# Patient Record
Sex: Female | Born: 1945
Health system: Southern US, Community
[De-identification: ages and names within clinical notes are randomized; demographics above are authoritative.]

## PROBLEM LIST (undated history)

## (undated) DIAGNOSIS — G35 Multiple sclerosis: Secondary | ICD-10-CM

## (undated) DIAGNOSIS — I639 Cerebral infarction, unspecified: Secondary | ICD-10-CM

## (undated) DIAGNOSIS — I10 Essential (primary) hypertension: Secondary | ICD-10-CM

## (undated) DIAGNOSIS — A159 Respiratory tuberculosis unspecified: Secondary | ICD-10-CM

## (undated) DIAGNOSIS — E785 Hyperlipidemia, unspecified: Secondary | ICD-10-CM

## (undated) HISTORY — DX: Hyperlipidemia, unspecified: E78.5

## (undated) HISTORY — PX: ABDOMINAL HYSTERECTOMY: SHX81

## (undated) HISTORY — DX: Essential (primary) hypertension: I10

## (undated) HISTORY — DX: Multiple sclerosis: G35

## (undated) HISTORY — PX: FLEXIBLE SIGMOIDOSCOPY: SHX1649

## (undated) HISTORY — PX: TUBAL LIGATION: SHX77

## (undated) HISTORY — DX: Respiratory tuberculosis unspecified: A15.9

## (undated) HISTORY — PX: OTHER SURGICAL HISTORY: SHX169

---

## 1998-11-25 ENCOUNTER — Other Ambulatory Visit: Admission: RE | Admit: 1998-11-25 | Discharge: 1998-11-25 | Payer: Self-pay | Admitting: Gynecology

## 1999-01-12 ENCOUNTER — Ambulatory Visit (HOSPITAL_BASED_OUTPATIENT_CLINIC_OR_DEPARTMENT_OTHER): Admission: RE | Admit: 1999-01-12 | Discharge: 1999-01-12 | Payer: Self-pay | Admitting: General Surgery

## 1999-01-12 ENCOUNTER — Encounter: Payer: Self-pay | Admitting: General Surgery

## 1999-08-11 ENCOUNTER — Encounter: Admission: RE | Admit: 1999-08-11 | Discharge: 1999-08-11 | Payer: Self-pay | Admitting: Internal Medicine

## 1999-08-11 ENCOUNTER — Encounter: Payer: Self-pay | Admitting: Internal Medicine

## 1999-12-15 ENCOUNTER — Other Ambulatory Visit: Admission: RE | Admit: 1999-12-15 | Discharge: 1999-12-15 | Payer: Self-pay | Admitting: Gynecology

## 1999-12-30 ENCOUNTER — Encounter: Payer: Self-pay | Admitting: Gynecology

## 1999-12-30 ENCOUNTER — Encounter: Admission: RE | Admit: 1999-12-30 | Discharge: 1999-12-30 | Payer: Self-pay | Admitting: Gynecology

## 2000-02-02 ENCOUNTER — Encounter: Admission: RE | Admit: 2000-02-02 | Discharge: 2000-03-30 | Payer: Self-pay | Admitting: Chiropractic Medicine

## 2000-11-23 ENCOUNTER — Other Ambulatory Visit: Admission: RE | Admit: 2000-11-23 | Discharge: 2000-11-23 | Payer: Self-pay | Admitting: Gynecology

## 2001-01-01 ENCOUNTER — Encounter: Admission: RE | Admit: 2001-01-01 | Discharge: 2001-01-01 | Payer: Self-pay | Admitting: Gynecology

## 2001-01-01 ENCOUNTER — Encounter: Payer: Self-pay | Admitting: Gynecology

## 2002-02-06 ENCOUNTER — Encounter: Admission: RE | Admit: 2002-02-06 | Discharge: 2002-02-06 | Payer: Self-pay | Admitting: Gynecology

## 2002-02-06 ENCOUNTER — Encounter: Payer: Self-pay | Admitting: Gynecology

## 2002-03-15 ENCOUNTER — Other Ambulatory Visit: Admission: RE | Admit: 2002-03-15 | Discharge: 2002-03-15 | Payer: Self-pay | Admitting: Gynecology

## 2003-05-01 ENCOUNTER — Other Ambulatory Visit: Admission: RE | Admit: 2003-05-01 | Discharge: 2003-05-01 | Payer: Self-pay | Admitting: Gynecology

## 2004-04-20 ENCOUNTER — Encounter: Admission: RE | Admit: 2004-04-20 | Discharge: 2004-04-20 | Payer: Self-pay | Admitting: Gynecology

## 2004-06-24 ENCOUNTER — Ambulatory Visit: Payer: Self-pay | Admitting: Internal Medicine

## 2004-07-02 ENCOUNTER — Ambulatory Visit: Payer: Self-pay | Admitting: Internal Medicine

## 2004-09-23 ENCOUNTER — Ambulatory Visit: Payer: Self-pay | Admitting: Internal Medicine

## 2004-09-30 ENCOUNTER — Ambulatory Visit: Payer: Self-pay | Admitting: Internal Medicine

## 2005-04-06 ENCOUNTER — Ambulatory Visit: Payer: Self-pay | Admitting: Internal Medicine

## 2005-05-13 ENCOUNTER — Other Ambulatory Visit: Admission: RE | Admit: 2005-05-13 | Discharge: 2005-05-13 | Payer: Self-pay | Admitting: Gynecology

## 2005-05-20 ENCOUNTER — Encounter: Admission: RE | Admit: 2005-05-20 | Discharge: 2005-05-20 | Payer: Self-pay | Admitting: Gynecology

## 2005-10-11 ENCOUNTER — Ambulatory Visit: Payer: Self-pay | Admitting: Internal Medicine

## 2006-04-03 ENCOUNTER — Ambulatory Visit: Payer: Self-pay | Admitting: Internal Medicine

## 2006-05-23 ENCOUNTER — Encounter: Admission: RE | Admit: 2006-05-23 | Discharge: 2006-05-23 | Payer: Self-pay | Admitting: Gynecology

## 2006-07-11 ENCOUNTER — Ambulatory Visit: Payer: Self-pay | Admitting: Internal Medicine

## 2006-07-11 LAB — CONVERTED CEMR LAB
Chol/HDL Ratio, serum: 2.2
Cholesterol: 238 mg/dL (ref 0–200)
LDL DIRECT: 105.7 mg/dL

## 2006-11-22 ENCOUNTER — Encounter: Payer: Self-pay | Admitting: Internal Medicine

## 2006-11-28 ENCOUNTER — Ambulatory Visit: Payer: Self-pay | Admitting: Internal Medicine

## 2007-03-02 ENCOUNTER — Ambulatory Visit: Payer: Self-pay | Admitting: Internal Medicine

## 2007-04-25 ENCOUNTER — Telehealth: Payer: Self-pay | Admitting: Internal Medicine

## 2007-06-12 ENCOUNTER — Ambulatory Visit: Payer: Self-pay | Admitting: Internal Medicine

## 2007-06-12 DIAGNOSIS — I10 Essential (primary) hypertension: Secondary | ICD-10-CM

## 2007-06-12 DIAGNOSIS — G35 Multiple sclerosis: Secondary | ICD-10-CM

## 2007-06-12 DIAGNOSIS — R32 Unspecified urinary incontinence: Secondary | ICD-10-CM | POA: Insufficient documentation

## 2007-06-12 DIAGNOSIS — E785 Hyperlipidemia, unspecified: Secondary | ICD-10-CM

## 2007-06-12 LAB — CONVERTED CEMR LAB
Cholesterol: 234 mg/dL (ref 0–200)
Direct LDL: 117.2 mg/dL
Total CHOL/HDL Ratio: 2.5
Triglycerides: 95 mg/dL (ref 0–149)

## 2007-07-24 ENCOUNTER — Encounter: Admission: RE | Admit: 2007-07-24 | Discharge: 2007-07-24 | Payer: Self-pay | Admitting: Gynecology

## 2007-09-12 ENCOUNTER — Ambulatory Visit: Payer: Self-pay | Admitting: Internal Medicine

## 2007-11-19 ENCOUNTER — Encounter: Payer: Self-pay | Admitting: Internal Medicine

## 2008-12-01 ENCOUNTER — Encounter: Admission: RE | Admit: 2008-12-01 | Discharge: 2008-12-01 | Payer: Self-pay | Admitting: Gynecology

## 2008-12-08 ENCOUNTER — Encounter: Payer: Self-pay | Admitting: Internal Medicine

## 2008-12-30 ENCOUNTER — Encounter: Admission: RE | Admit: 2008-12-30 | Discharge: 2009-03-30 | Payer: Self-pay | Admitting: Neurology

## 2009-01-21 ENCOUNTER — Telehealth: Payer: Self-pay | Admitting: Internal Medicine

## 2009-01-22 ENCOUNTER — Ambulatory Visit: Payer: Self-pay | Admitting: Internal Medicine

## 2009-01-22 DIAGNOSIS — N390 Urinary tract infection, site not specified: Secondary | ICD-10-CM

## 2009-01-22 LAB — CONVERTED CEMR LAB
Blood in Urine, dipstick: NEGATIVE
Glucose, Urine, Semiquant: NEGATIVE

## 2009-09-28 ENCOUNTER — Telehealth: Payer: Self-pay | Admitting: Internal Medicine

## 2009-12-23 ENCOUNTER — Encounter: Admission: RE | Admit: 2009-12-23 | Discharge: 2009-12-23 | Payer: Self-pay | Admitting: Gynecology

## 2009-12-28 ENCOUNTER — Encounter: Payer: Self-pay | Admitting: Internal Medicine

## 2010-08-19 ENCOUNTER — Encounter: Payer: Self-pay | Admitting: Internal Medicine

## 2010-09-14 NOTE — Letter (Signed)
Summary: Gynecology-Dr. Nicholas Lose  Gynecology-Dr. Lomax   Imported By: Maryln Gottron 01/04/2010 12:19:45  _____________________________________________________________________  External Attachment:    Type:   Image     Comment:   External Document

## 2010-09-14 NOTE — Progress Notes (Signed)
Summary: new patient  Phone Note Call from Patient   Caller: Patient Call For: Stacie Glaze MD Reason for Call: Acute Illness Summary of Call: wants to know if you'll take her son Allison Bailey (31) as a new patient- he has no insurance. Initial call taken by: Raechel Ache, RN,  September 28, 2009 10:15 AM  Follow-up for Phone Call        dr Lovell Sheehan said ok but please let mom know we collect a partial payment since no insurance Follow-up by: Willy Eddy, LPN,  September 28, 2009 11:19 AM  Additional Follow-up for Phone Call Additional follow up Details #1::        2nd call about getting appt for her son. [phone]- [phone] Additional Follow-up by: Raechel Ache, RN,  September 29, 2009 8:26 AM    Additional Follow-up for Phone Call Additional follow up Details #2::    returned called to [facility] advising Dr. Lovell Sheehan would accept as a new pt.  However, he is responisble for the $125 ov charge at time of service.  Follow-up by: Trixie Dredge,  September 29, 2009 9:12 AM

## 2010-09-16 NOTE — Letter (Signed)
Summary: Dr. Beather Arbour  Dr. Beather Arbour   Imported By: Maryln Gottron 08/30/2010 15:34:07  _____________________________________________________________________  External Attachment:    Type:   Image     Comment:   External Document

## 2011-02-01 ENCOUNTER — Other Ambulatory Visit: Payer: Self-pay | Admitting: Gynecology

## 2011-02-01 DIAGNOSIS — Z1231 Encounter for screening mammogram for malignant neoplasm of breast: Secondary | ICD-10-CM

## 2011-02-13 LAB — HM MAMMOGRAPHY

## 2011-02-13 LAB — HM PAP SMEAR: HM Pap smear: NORMAL

## 2011-02-17 ENCOUNTER — Other Ambulatory Visit: Payer: Self-pay | Admitting: Gynecology

## 2011-02-25 ENCOUNTER — Ambulatory Visit
Admission: RE | Admit: 2011-02-25 | Discharge: 2011-02-25 | Disposition: A | Discharge status: Home / Self Care | Payer: Medicare Other | Source: Ambulatory Visit | Attending: Gynecology | Admitting: Gynecology

## 2011-02-25 DIAGNOSIS — Z1231 Encounter for screening mammogram for malignant neoplasm of breast: Secondary | ICD-10-CM

## 2011-03-14 ENCOUNTER — Other Ambulatory Visit (INDEPENDENT_AMBULATORY_CARE_PROVIDER_SITE_OTHER): Payer: Medicare Other

## 2011-03-14 DIAGNOSIS — G35 Multiple sclerosis: Secondary | ICD-10-CM

## 2011-03-14 DIAGNOSIS — Z0000 Encounter for general adult medical examination without abnormal findings: Secondary | ICD-10-CM

## 2011-03-14 DIAGNOSIS — Z79899 Other long term (current) drug therapy: Secondary | ICD-10-CM

## 2011-03-14 DIAGNOSIS — I10 Essential (primary) hypertension: Secondary | ICD-10-CM

## 2011-03-14 DIAGNOSIS — E785 Hyperlipidemia, unspecified: Secondary | ICD-10-CM

## 2011-03-14 LAB — POCT URINALYSIS DIPSTICK
Ketones, UA: NEGATIVE
Nitrite, UA: NEGATIVE
Spec Grav, UA: 1.03
pH, UA: 5.5

## 2011-03-14 LAB — CBC WITH DIFFERENTIAL/PLATELET
Basophils Absolute: 0 10*3/uL (ref 0.0–0.1)
Eosinophils Relative: 1.5 % (ref 0.0–5.0)
Lymphocytes Relative: 22.5 % (ref 12.0–46.0)
Lymphs Abs: 1.2 10*3/uL (ref 0.7–4.0)
Neutrophils Relative %: 67.4 % (ref 43.0–77.0)
RBC: 4.4 Mil/uL (ref 3.87–5.11)
RDW: 14.5 % (ref 11.5–14.6)
WBC: 5.1 10*3/uL (ref 4.5–10.5)

## 2011-03-14 LAB — TSH: TSH: 1.53 u[IU]/mL (ref 0.35–5.50)

## 2011-03-14 LAB — HEPATIC FUNCTION PANEL
ALT: 14 U/L (ref 0–35)
Albumin: 3.6 g/dL (ref 3.5–5.2)
Bilirubin, Direct: 0.1 mg/dL (ref 0.0–0.3)
Total Protein: 6.9 g/dL (ref 6.0–8.3)

## 2011-03-14 LAB — BASIC METABOLIC PANEL
CO2: 24 mEq/L (ref 19–32)
Chloride: 113 mEq/L — ABNORMAL HIGH (ref 96–112)
Creatinine, Ser: 0.6 mg/dL (ref 0.4–1.2)
Glucose, Bld: 107 mg/dL — ABNORMAL HIGH (ref 70–99)

## 2011-03-14 LAB — LIPID PANEL
Total CHOL/HDL Ratio: 2
Triglycerides: 85 mg/dL (ref 0.0–149.0)

## 2011-03-17 ENCOUNTER — Encounter: Payer: Self-pay | Admitting: Internal Medicine

## 2011-03-21 ENCOUNTER — Ambulatory Visit (INDEPENDENT_AMBULATORY_CARE_PROVIDER_SITE_OTHER): Payer: Medicare Other | Admitting: Internal Medicine

## 2011-03-21 ENCOUNTER — Encounter: Payer: Self-pay | Admitting: Internal Medicine

## 2011-03-21 VITALS — BP 112/80 | HR 70 | Temp 98.3°F | Resp 16 | Ht 64.5 in | Wt 168.0 lb

## 2011-03-21 DIAGNOSIS — E785 Hyperlipidemia, unspecified: Secondary | ICD-10-CM

## 2011-03-21 DIAGNOSIS — G35 Multiple sclerosis: Secondary | ICD-10-CM

## 2011-03-21 DIAGNOSIS — Z0000 Encounter for general adult medical examination without abnormal findings: Secondary | ICD-10-CM

## 2011-03-21 DIAGNOSIS — I10 Essential (primary) hypertension: Secondary | ICD-10-CM

## 2011-03-21 MED ORDER — POTASSIUM CHLORIDE ER 8 MEQ PO CPCR: 8.0000 meq | ORAL_CAPSULE | Freq: Every day | ORAL | Status: DC

## 2011-03-21 MED ORDER — INTERFERON BETA-1B 0.3 MG ~~LOC~~ SOLR: 0.2500 mg | SUBCUTANEOUS | Status: DC

## 2011-03-21 NOTE — Patient Instructions (Signed)
Your blood work was very good general your cholesterol is excellent with your good cholesterol being higher than your bad cholesterol. I have added a potassium tablet to take once a day for your low potassium  The best exercise for swelling in the legs would be a daily walk. You could use compression hose but I believe he would have difficulty pulling the mom otherwise elevate the legs as much as possible the swelling comes from poor muscle tone in the legs

## 2011-03-21 NOTE — Progress Notes (Signed)
Subjective:    Patient ID: Allison Bailey, female    DOB: [birth year], 65 y.o.   MRN: [id number]  HPI  Patient has multiple sclerosis her ambulation has been extremely poor for difficulty with transfer skills today from the chair to the bed.  She states that she walks some at home but she was unable to demonstrate that here she did just start back on Betaseron her son is her primary caregiver and he is with her today.  She is worried about memory function however she passes a clock test with these and can reverse numbers and can interpret proverbs.  I do believe that due to her overall condition her husband's chronic condition and the worrisome she has had this involves her memory perceived disorder I do not believe that she has any memory or cognitive functioning disorder.  Other than mild/moderate depression She presents today for a wellness exam  Review of Systems  Constitutional: Positive for activity change and fatigue. Negative for unexpected weight change.  HENT: Negative.   Eyes: Negative.   Respiratory: Negative.   Cardiovascular: Negative.   Gastrointestinal: Positive for constipation.  Genitourinary: Positive for urgency and frequency.  Musculoskeletal: Positive for gait problem.  Skin: Negative.   Neurological: Positive for dizziness, weakness and numbness.  Hematological: Negative.   Psychiatric/Behavioral: Positive for dysphoric mood and decreased concentration.   Past Medical History  Diagnosis Date  . MS (multiple sclerosis)   . Hypertension   . Tuberculosis   . Urinary incontinence    Past Surgical History  Procedure Date  . Abdominal hysterectomy   . Flexible sigmoidoscopy   . Left breast biopsy   . Tubal ligation     reports that she has quit smoking. She has never used smokeless tobacco. She reports that she does not drink alcohol or use illicit drugs. family history includes Hypertension in an unspecified family member. No Known Allergies        Objective:   Physical Exam  Nursing note and vitals reviewed. Constitutional: She is oriented to person, place, and time. No distress.       Patient remains in a wheelchair because of her inability to transfer to the bed  HENT:  Head: Normocephalic and atraumatic.  Right Ear: External ear normal.  Left Ear: External ear normal.  Mouth/Throat: Oropharynx is clear and moist.  Eyes: Conjunctivae are normal. Pupils are equal, round, and reactive to light.  Neck: Normal range of motion. Neck supple.  Cardiovascular: Normal rate and normal heart sounds.   Pulmonary/Chest: Breath sounds normal. She has no wheezes. She has no rales. She exhibits no tenderness.  Abdominal: She exhibits distension. She exhibits no mass. There is no tenderness.  Genitourinary:       Deferred to her gynecologist  Musculoskeletal:       Some muscle strength loss in her lower extremities and muscle mass loss in the proximal muscles  Neurological: She is alert and oriented to person, place, and time. She displays abnormal reflex. She exhibits abnormal muscle tone.  Skin: Skin is warm and dry. She is not diaphoretic.  Psychiatric: She has a normal mood and affect. Her behavior is normal.          Assessment & Plan:   This is a routine physical examination for this African American  Female with progressive MS. Reviewed all health maintenance protocols including mammography colonoscopy bone density and reviewed appropriate screening labs. Her immunization history was reviewed as well as her current medications and allergies  refills of her chronic medications were given and the plan for yearly health maintenance was discussed all orders and referrals were made as   The patient has resumed her Betaseron we encouraged her to exercise on a regular basis with short perhaps even five-minute walks with a walker multiple times a day.  Cholesterol is at goal, her potassium was low but she has not been taking her potassium  chloride daily we reinforced the need to take that every day.  She does have intermittent, constipation and loose stools for which he takes Imodium I suspect the low potassium secondary to the diarrhea.

## 2011-03-31 ENCOUNTER — Encounter: Payer: Self-pay | Admitting: Internal Medicine

## 2011-07-25 ENCOUNTER — Ambulatory Visit: Payer: Medicare Other | Admitting: Internal Medicine

## 2011-09-08 ENCOUNTER — Ambulatory Visit (INDEPENDENT_AMBULATORY_CARE_PROVIDER_SITE_OTHER): Payer: Medicare Other | Admitting: Internal Medicine

## 2011-09-08 DIAGNOSIS — Z23 Encounter for immunization: Secondary | ICD-10-CM

## 2011-12-28 ENCOUNTER — Ambulatory Visit (INDEPENDENT_AMBULATORY_CARE_PROVIDER_SITE_OTHER): Payer: Medicare Other | Admitting: Family

## 2011-12-28 ENCOUNTER — Encounter: Payer: Self-pay | Admitting: Family

## 2011-12-28 VITALS — BP 140/90 | HR 82 | Temp 98.5°F

## 2011-12-28 DIAGNOSIS — M79609 Pain in unspecified limb: Secondary | ICD-10-CM

## 2011-12-28 DIAGNOSIS — M79604 Pain in right leg: Secondary | ICD-10-CM

## 2011-12-28 DIAGNOSIS — R609 Edema, unspecified: Secondary | ICD-10-CM

## 2011-12-28 LAB — CBC WITH DIFFERENTIAL/PLATELET
Basophils Absolute: 0 10*3/uL (ref 0.0–0.1)
Basophils Relative: 0.6 % (ref 0.0–3.0)
Eosinophils Absolute: 0 10*3/uL (ref 0.0–0.7)
Eosinophils Relative: 1.1 % (ref 0.0–5.0)
Lymphocytes Relative: 45.4 % (ref 12.0–46.0)
Lymphs Abs: 2.1 10*3/uL (ref 0.7–4.0)
MCHC: 32.5 g/dL (ref 30.0–36.0)
MCV: 81.2 fl (ref 78.0–100.0)
Monocytes Relative: 9.6 % (ref 3.0–12.0)

## 2011-12-28 LAB — TSH: TSH: 2.73 u[IU]/mL (ref 0.35–5.50)

## 2011-12-28 LAB — BASIC METABOLIC PANEL
CO2: 27 mEq/L (ref 19–32)
Potassium: 3.9 mEq/L (ref 3.5–5.1)

## 2011-12-28 MED ORDER — FUROSEMIDE 20 MG PO TABS: 20.0000 mg | ORAL_TABLET | Freq: Every day | ORAL | Status: DC

## 2011-12-28 NOTE — Progress Notes (Signed)
Subjective:    Patient ID: Allison Bailey, female    DOB: 1945/08/21, 66 y.o.   MRN: [id number]  HPI 66 year old American female, nonsmoker, patient of Dr. Lovell Sheehan is in today with bilateral leg swelling and pain x2 weeks. She recently pain in her legs are 5/10 particularly for the ankles. Has no known history of swelling, renal disease, or cardiovascular disease. Does report an increase in her sodium intake eating a lot of fast food recently. Patient denies any urinary concerns, no shortness of breath, chest pain, palpitations, fatigue, no past medical history of any thyroid issues.   Review of Systems  Constitutional: Negative.   HENT: Negative.   Cardiovascular: Positive for leg swelling. Negative for chest pain and palpitations.  Gastrointestinal: Negative.   Genitourinary: Negative.  Negative for decreased urine volume.  Musculoskeletal: Negative.   Skin: Negative.   Neurological: Negative.   Hematological: Negative.   Psychiatric/Behavioral: Negative.    Past Medical History  Diagnosis Date  . MS (multiple sclerosis)   . Hypertension   . Tuberculosis   . Urinary incontinence     History   Social History  . Marital Status: Married    Spouse Name: N/A    Number of Children: N/A  . Years of Education: N/A   Occupational History  . Not on file.   Social History Main Topics  . Smoking status: Former Games developer  . Smokeless tobacco: Never Used  . Alcohol Use: No  . Drug Use: No  . Sexually Active: Not on file   Other Topics Concern  . Not on file   Social History Narrative  . No narrative on file    Past Surgical History  Procedure Date  . Abdominal hysterectomy   . Flexible sigmoidoscopy   . Left breast biopsy   . Tubal ligation     Family History  Problem Relation Age of Onset  . Hypertension      No Known Allergies  Current Outpatient Prescriptions on File Prior to Visit  Medication Sig Dispense Refill  . baclofen (LIORESAL) 10 MG tablet Take by  mouth. 1/2 qd        . furosemide (LASIX) 20 MG tablet Take 1 tablet (20 mg total) by mouth daily.  30 tablet  1  . interferon beta-1b (BETASERON) 0.3 MG injection Inject 0.25 mg into the skin every other day.  1 each  12  . loperamide (IMODIUM A-D) 2 MG tablet Take 2 mg by mouth daily.        . Potassium Chloride CR (MICRO-K) 8 MEQ CPCR Take 1 capsule (8 mEq total) by mouth daily.  30 capsule  11    BP 140/90  Pulse 82  Temp(Src) 98.5 F (36.9 C) (Oral)  SpO2 98%chart    Objective:   Physical Exam  Constitutional: She appears well-developed and well-nourished.  HENT:  Right Ear: External ear normal.  Left Ear: External ear normal.  Nose: Nose normal.  Mouth/Throat: Oropharynx is clear and moist.  Neck: Normal range of motion. Neck supple. No thyromegaly present.  Cardiovascular: Normal rate, regular rhythm and normal heart sounds.  Exam reveals no gallop and no friction rub.   No murmur heard. Pulmonary/Chest: Effort normal and breath sounds normal. No respiratory distress.  Abdominal: Soft. Bowel sounds are normal.  Musculoskeletal: Normal range of motion. She exhibits edema.       Bilateral 2+ pitting edema. Tenderness to palpation. No redness. Signs and symptoms of DVT.  Skin: Skin is warm and dry.  Psychiatric: She has a normal mood and affect.          Assessment & Plan:  Assessment: Peripheral edema-likely related to sodium retention, bilateral ankle pain  Plan: Lasix 20 mg once a day. We'll bring patient back in for recheck in 3 weeks. Lab sent to include UA, CBC, BMP, and TSH will notify patient pending results. Patient to call the office if symptoms worsen or persist. Recheck as discussed and sooner when necessary. Low-sodium diet.

## 2011-12-28 NOTE — Patient Instructions (Signed)
Peripheral Edema You have swelling in your legs (peripheral edema). This swelling is due to excess accumulation of salt and water in your body. Edema may be a sign of heart, kidney or liver disease, or a side effect of a medication. It may also be due to problems in the leg veins. Elevating your legs and using special support stockings may be very helpful, if the cause of the swelling is due to poor venous circulation. Avoid long periods of standing, whatever the cause. Treatment of edema depends on identifying the cause. Chips, pretzels, pickles and other salty foods should be avoided. Restricting salt in your diet is almost always needed. Water pills (diuretics) are often used to remove the excess salt and water from your body via urine. These medicines prevent the kidney from reabsorbing sodium. This increases urine flow. Diuretic treatment may also result in lowering of potassium levels in your body. Potassium supplements may be needed if you have to use diuretics daily. Daily weights can help you keep track of your progress in clearing your edema. You should call your caregiver for follow up care as recommended. SEEK IMMEDIATE MEDICAL CARE IF:   You have increased swelling, pain, redness, or heat in your legs.   You develop shortness of breath, especially when lying down.   You develop chest or abdominal pain, weakness, or fainting.   You have a fever.  Document Released: 09/08/2004 Document Revised: 07/21/2011 Document Reviewed: 08/19/2009 [employer] Patient Information 2012 [employer]. 

## 2012-04-04 ENCOUNTER — Other Ambulatory Visit: Payer: Self-pay | Admitting: Internal Medicine

## 2012-04-04 ENCOUNTER — Ambulatory Visit (INDEPENDENT_AMBULATORY_CARE_PROVIDER_SITE_OTHER): Payer: Medicare Other | Admitting: Internal Medicine

## 2012-04-04 ENCOUNTER — Encounter: Payer: Self-pay | Admitting: Internal Medicine

## 2012-04-04 VITALS — BP 140/90 | HR 76 | Temp 98.2°F | Resp 16 | Ht 64.5 in | Wt 168.0 lb

## 2012-04-04 DIAGNOSIS — R300 Dysuria: Secondary | ICD-10-CM

## 2012-04-04 DIAGNOSIS — N390 Urinary tract infection, site not specified: Secondary | ICD-10-CM

## 2012-04-04 DIAGNOSIS — G35 Multiple sclerosis: Secondary | ICD-10-CM

## 2012-04-04 DIAGNOSIS — Z0000 Encounter for general adult medical examination without abnormal findings: Secondary | ICD-10-CM

## 2012-04-04 DIAGNOSIS — Z79899 Other long term (current) drug therapy: Secondary | ICD-10-CM

## 2012-04-04 LAB — BASIC METABOLIC PANEL WITH GFR
BUN: 22 mg/dL (ref 6–23)
CO2: 28 meq/L (ref 19–32)
Calcium: 9.7 mg/dL (ref 8.4–10.5)
Chloride: 106 meq/L (ref 96–112)
Creatinine, Ser: 0.6 mg/dL (ref 0.4–1.2)
GFR: 124.2 mL/min
Glucose, Bld: 86 mg/dL (ref 70–99)
Potassium: 4.5 meq/L (ref 3.5–5.1)
Sodium: 141 meq/L (ref 135–145)

## 2012-04-04 LAB — HEPATIC FUNCTION PANEL
ALT: 13 U/L (ref 0–35)
AST: 17 U/L (ref 0–37)
Albumin: 3.7 g/dL (ref 3.5–5.2)
Alkaline Phosphatase: 60 U/L (ref 39–117)
Total Bilirubin: 0.7 mg/dL (ref 0.3–1.2)
Total Protein: 7.7 g/dL (ref 6.0–8.3)

## 2012-04-04 LAB — POCT URINALYSIS DIPSTICK
Glucose, UA: NEGATIVE
Spec Grav, UA: >=1.03
Urobilinogen, UA: 0.2

## 2012-04-04 LAB — CBC WITH DIFFERENTIAL/PLATELET
Basophils Absolute: 0 10*3/uL (ref 0.0–0.1)
Basophils Relative: 0.3 % (ref 0.0–3.0)
Eosinophils Relative: 0.9 % (ref 0.0–5.0)
Lymphocytes Relative: 43.8 % (ref 12.0–46.0)
Lymphs Abs: 2.5 10*3/uL (ref 0.7–4.0)
MCV: 82.4 fl (ref 78.0–100.0)
Platelets: 269 10*3/uL (ref 150.0–400.0)
RBC: 5.17 Mil/uL — ABNORMAL HIGH (ref 3.87–5.11)
RDW: 14.7 % — ABNORMAL HIGH (ref 11.5–14.6)

## 2012-04-04 LAB — TSH: TSH: 1.6 u[IU]/mL (ref 0.35–5.50)

## 2012-04-04 LAB — LIPID PANEL
HDL: 87.3 mg/dL (ref >39.00)
VLDL: 19.2 mg/dL (ref 0.0–40.0)

## 2012-04-04 NOTE — Progress Notes (Signed)
  Subjective:    Patient ID: Allison Bailey, female    DOB: Mar 25, 1946, 66 y.o.   MRN: [id number]  HPI Patient is a 66 year old female who presents for Medicare physical.  She is [employer] Medicare.  She is a diagnosis of multiple sclerosis and had been him in [place] on but apparently the Betaseron injection therapy has lapsed.  We will set her back up with nephrology to resume the Betaseron because of her weakness.  She had a history of lower extremity edema and took Lasix and potassium.  Otherwise she has been stable on her current medications he has a history of chronic UTIs and urinary incontinence   Review of Systems  Constitutional: Positive for activity change, appetite change and fatigue.  HENT: Positive for hearing loss and postnasal drip. Negative for ear pain, congestion, neck pain and sinus pressure.   Eyes: Negative for redness and visual disturbance.  Respiratory: Negative for cough, shortness of breath and wheezing.   Cardiovascular: Positive for palpitations.  Gastrointestinal: Negative for abdominal pain and abdominal distention.  Genitourinary: Negative for dysuria, frequency and menstrual problem.  Musculoskeletal: Positive for myalgias, joint swelling and gait problem. Negative for arthralgias.  Skin: Negative for rash and wound.  Neurological: Positive for tremors, weakness and light-headedness. Negative for dizziness and headaches.  Hematological: Negative for adenopathy. Does not bruise/bleed easily.  Psychiatric/Behavioral: Positive for dysphoric mood. Negative for disturbed wake/sleep cycle and decreased concentration.       Objective:   Physical Exam  Nursing note and vitals reviewed. Constitutional: She is oriented to person, place, and time. She appears well-developed and well-nourished.  HENT:  Head: Normocephalic and atraumatic.  Eyes: Conjunctivae are normal. Pupils are equal, round, and reactive to light.  Neck: Normal range of motion.  Neck supple.  Cardiovascular:  Murmur heard. Pulmonary/Chest: She is in respiratory distress.  Abdominal: Soft. Bowel sounds are normal.  Musculoskeletal: Normal range of motion.  Neurological: She is alert and oriented to person, place, and time.  Skin: Skin is warm and dry.  Psychiatric: She has a normal mood and affect. Her behavior is normal.          Assessment & Plan:   This is a routine physical examination for this healthy  Female. Reviewed all health maintenance protocols including mammography colonoscopy bone density and reviewed appropriate screening labs. Her immunization history was reviewed as well as her current medications and allergies refills of her chronic medications were given and the plan for yearly health maintenance was discussed all orders and referrals were made as appropriate.   Bladder dystonia with frequency.  We'll obtain a urine specimen for urinalysis and culture she has had chronic UTI in the past I suspect with the acute nature of her worsening symptoms of infection may play a role after we obtain the specimen we'll empirically put her on Cipro 250 twice a day for 5 day course while awaiting the results of the culture change the antibiotic as appropriate with culture.  We have referred her to neurology to resume Betaseron.  We will monitor appropriate lab work including lipid liver a basic metabolic panel urinalysis and urine culture

## 2012-04-04 NOTE — Patient Instructions (Addendum)
The patient is instructed to continue all medications as prescribed. Schedule followup with check out clerk upon leaving the clinic  

## 2012-04-07 LAB — URINE CULTURE: Colony Count: >=100000

## 2012-04-11 ENCOUNTER — Telehealth: Payer: Self-pay | Admitting: Internal Medicine

## 2012-04-11 MED ORDER — CIPROFLOXACIN HCL 250 MG PO TABS: 250.0000 mg | ORAL_TABLET | Freq: Two times a day (BID) | ORAL | Status: AC

## 2012-04-11 NOTE — Telephone Encounter (Signed)
Caller: [name]/Patient; Patient Name: Allison Bailey; PCP: Darryll Capers (Adults only); Best Callback Phone Number: [phone] States was seen in office for "annual physical" last week. Discussed with MD the problem she has controlling urine. Has frequent accidents, losing control of urine and has happened 3 times in past 2-3 days. Denies painful urination.  Has "cut back" on fluids but does not help. Is going on a road trip 8-29 and is wanting prescription to help control bladder. Advised appointment but no available appointments with PCp or PA 8-29. PLEASE CALL PATIENT AND ADVISE IF MD WILL PRESCRIBE SOMETHING FOR BLADDER CONTROL OR IF CAN WORK IN TO SEE MD 8-29

## 2012-04-11 NOTE — Telephone Encounter (Signed)
Per er Lovell Sheehan- give cipro       250 bid for 10 days

## 2012-04-24 ENCOUNTER — Other Ambulatory Visit: Payer: Self-pay | Admitting: Gynecology

## 2012-04-24 DIAGNOSIS — Z1231 Encounter for screening mammogram for malignant neoplasm of breast: Secondary | ICD-10-CM

## 2012-04-26 ENCOUNTER — Other Ambulatory Visit: Payer: Self-pay | Admitting: Neurology

## 2012-04-26 DIAGNOSIS — G35 Multiple sclerosis: Secondary | ICD-10-CM

## 2012-05-01 ENCOUNTER — Ambulatory Visit
Admission: RE | Admit: 2012-05-01 | Discharge: 2012-05-01 | Disposition: A | Discharge status: Home / Self Care | Payer: Medicare Other | Source: Ambulatory Visit | Attending: Neurology | Admitting: Neurology

## 2012-05-01 DIAGNOSIS — G35 Multiple sclerosis: Secondary | ICD-10-CM

## 2012-05-01 MED ORDER — GADOBENATE DIMEGLUMINE 529 MG/ML IV SOLN
15.0000 mL | Freq: Once | INTRAVENOUS | Status: AC | PRN
  Administered 2012-05-01: 15 mL via INTRAVENOUS

## 2012-05-11 ENCOUNTER — Ambulatory Visit
Admission: RE | Admit: 2012-05-11 | Discharge: 2012-05-11 | Disposition: A | Discharge status: Home / Self Care | Payer: Medicare Other | Source: Ambulatory Visit | Attending: Gynecology | Admitting: Gynecology

## 2012-05-11 ENCOUNTER — Other Ambulatory Visit: Payer: Self-pay | Admitting: Internal Medicine

## 2012-05-11 DIAGNOSIS — Z1231 Encounter for screening mammogram for malignant neoplasm of breast: Secondary | ICD-10-CM

## 2012-07-05 ENCOUNTER — Ambulatory Visit (INDEPENDENT_AMBULATORY_CARE_PROVIDER_SITE_OTHER): Payer: Medicare Other | Admitting: Internal Medicine

## 2012-07-05 ENCOUNTER — Encounter: Payer: Self-pay | Admitting: Internal Medicine

## 2012-07-05 VITALS — BP 134/80 | HR 76 | Temp 98.6°F | Resp 16

## 2012-07-05 DIAGNOSIS — E785 Hyperlipidemia, unspecified: Secondary | ICD-10-CM

## 2012-07-05 DIAGNOSIS — I10 Essential (primary) hypertension: Secondary | ICD-10-CM

## 2012-07-05 DIAGNOSIS — G35 Multiple sclerosis: Secondary | ICD-10-CM

## 2012-07-05 DIAGNOSIS — N390 Urinary tract infection, site not specified: Secondary | ICD-10-CM

## 2012-07-05 DIAGNOSIS — Z23 Encounter for immunization: Secondary | ICD-10-CM

## 2012-07-05 NOTE — Patient Instructions (Signed)
The patient is instructed to continue all medications as prescribed. Schedule followup with check out clerk upon leaving the clinic Please complete the information for the medications

## 2012-07-05 NOTE — Progress Notes (Signed)
Subjective:    Patient ID: Allison Bailey, female    DOB: 07-Nov-1945, 66 y.o.   MRN: [id number]  HPI  Patient has MS hyperlipidemia hypertension and and has not been on her behavior so on normal has she been on a replacement medication.  We urged followup on this with her neurologist that she get on the appropriate medication for her MS  Review of Systems  Constitutional: Positive for activity change and appetite change. Negative for fatigue.  HENT: Negative for ear pain, congestion, neck pain, postnasal drip and sinus pressure.   Eyes: Negative for redness and visual disturbance.  Respiratory: Negative for cough, shortness of breath and wheezing.   Cardiovascular: Positive for leg swelling.  Gastrointestinal: Negative for abdominal pain and abdominal distention.  Genitourinary: Negative for dysuria, frequency and menstrual problem.  Musculoskeletal: Positive for gait problem. Negative for myalgias, joint swelling and arthralgias.  Skin: Negative for rash and wound.  Neurological: Positive for tremors, weakness, light-headedness and numbness. Negative for dizziness and headaches.  Hematological: Negative for adenopathy. Does not bruise/bleed easily.  Psychiatric/Behavioral: Negative for sleep disturbance and decreased concentration.   Past Medical History  Diagnosis Date  . MS (multiple sclerosis)   . Hypertension   . Tuberculosis   . Urinary incontinence     History   Social History  . Marital Status: Married    Spouse Name: N/A    Number of Children: N/A  . Years of Education: N/A   Occupational History  . Not on file.   Social History Main Topics  . Smoking status: Former Games developer  . Smokeless tobacco: Never Used  . Alcohol Use: No  . Drug Use: No  . Sexually Active: Not on file   Other Topics Concern  . Not on file   Social History Narrative  . No narrative on file    Past Surgical History  Procedure Date  . Abdominal hysterectomy   . Flexible  sigmoidoscopy   . Left breast biopsy   . Tubal ligation     Family History  Problem Relation Age of Onset  . Hypertension      No Known Allergies  Current Outpatient Prescriptions on File Prior to Visit  Medication Sig Dispense Refill  . loperamide (IMODIUM A-D) 2 MG tablet Take 2 mg by mouth daily.        . Potassium Chloride CR (MICRO-K) 8 MEQ CPCR TAKE ONE CAPSULE BY MOUTH EVERY DAY  30 capsule  2    BP 134/80  Pulse 76  Temp 98.6 F (37 C)  Resp 16       Objective:   Physical Exam  Nursing note and vitals reviewed. Constitutional: She is oriented to person, place, and time. She appears well-developed and well-nourished.  HENT:  Head: Normocephalic and atraumatic.  Eyes: Conjunctivae normal are normal.  Cardiovascular: Regular rhythm.  Exam reveals gallop.   Murmur heard. Pulmonary/Chest: Effort normal and breath sounds normal.  Abdominal: Soft. Bowel sounds are normal.  Musculoskeletal: She exhibits edema.  Neurological: She is alert and oriented to person, place, and time.  Psychiatric: She has a normal mood and affect. Her behavior is normal.          Assessment & Plan:  Patient received a flu shot today.  Her blood pressure stable her current medications.  We discussed getting back on her MS medications and she will contact the neurologist to see if the prescription is available.  We also discussed her chronic UTIs and we'll obtain  a urinalysis today and treated as appropriate based upon the urine culture

## 2012-07-07 LAB — URINE CULTURE

## 2012-08-27 ENCOUNTER — Other Ambulatory Visit: Payer: Self-pay | Admitting: Family Medicine

## 2012-11-07 ENCOUNTER — Ambulatory Visit: Payer: Medicare Other | Admitting: Internal Medicine

## 2012-12-13 ENCOUNTER — Other Ambulatory Visit: Payer: Self-pay | Admitting: Neurology

## 2013-01-30 ENCOUNTER — Other Ambulatory Visit: Payer: Self-pay | Admitting: Internal Medicine

## 2013-03-18 DIAGNOSIS — M7989 Other specified soft tissue disorders: Secondary | ICD-10-CM | POA: Insufficient documentation

## 2013-03-18 DIAGNOSIS — B351 Tinea unguium: Secondary | ICD-10-CM | POA: Insufficient documentation

## 2013-03-18 DIAGNOSIS — M2040 Other hammer toe(s) (acquired), unspecified foot: Secondary | ICD-10-CM | POA: Insufficient documentation

## 2013-04-29 ENCOUNTER — Encounter: Payer: Self-pay | Admitting: Podiatry

## 2013-04-29 DIAGNOSIS — M2040 Other hammer toe(s) (acquired), unspecified foot: Secondary | ICD-10-CM

## 2013-04-29 DIAGNOSIS — B351 Tinea unguium: Secondary | ICD-10-CM

## 2013-04-29 DIAGNOSIS — M7989 Other specified soft tissue disorders: Secondary | ICD-10-CM

## 2013-06-10 ENCOUNTER — Ambulatory Visit: Payer: Self-pay | Admitting: Podiatry

## 2013-07-01 ENCOUNTER — Ambulatory Visit: Payer: Self-pay | Admitting: Podiatry

## 2013-07-08 ENCOUNTER — Encounter: Payer: Medicare Other | Admitting: Family

## 2013-07-08 DIAGNOSIS — Z0289 Encounter for other administrative examinations: Secondary | ICD-10-CM

## 2013-07-22 ENCOUNTER — Encounter: Payer: Medicare Other | Admitting: Family

## 2013-07-22 DIAGNOSIS — Z0289 Encounter for other administrative examinations: Secondary | ICD-10-CM

## 2013-08-20 ENCOUNTER — Other Ambulatory Visit: Payer: Self-pay | Admitting: *Deleted

## 2013-08-20 ENCOUNTER — Telehealth: Payer: Self-pay | Admitting: Internal Medicine

## 2013-08-20 MED ORDER — POTASSIUM CHLORIDE ER 8 MEQ PO CPCR: ORAL_CAPSULE | ORAL | Status: DC

## 2013-08-20 NOTE — Telephone Encounter (Signed)
[id number] [address] requesting refill of Potassium Chloride CR (MICRO-K) 8 MEQ CPCR #90

## 2013-08-20 NOTE — Telephone Encounter (Signed)
done

## 2014-01-29 ENCOUNTER — Other Ambulatory Visit: Payer: Self-pay | Admitting: Neurology

## 2014-01-30 NOTE — Telephone Encounter (Signed)
Last seen Sept 2013, was asked to follow up 4 months after that appt

## 2014-07-02 ENCOUNTER — Encounter: Payer: Self-pay | Admitting: Neurology

## 2014-07-08 ENCOUNTER — Encounter: Payer: Self-pay | Admitting: Neurology

## 2014-07-17 ENCOUNTER — Telehealth (HOSPITAL_COMMUNITY): Payer: Self-pay | Admitting: *Deleted

## 2014-07-17 NOTE — Telephone Encounter (Signed)
Spoke with pt and agrees to be scheduled for AWV to include Colon Ca screening & breast Ca screening.

## 2015-05-13 DIAGNOSIS — H18413 Arcus senilis, bilateral: Secondary | ICD-10-CM | POA: Diagnosis not present

## 2015-05-13 DIAGNOSIS — H40013 Open angle with borderline findings, low risk, bilateral: Secondary | ICD-10-CM | POA: Diagnosis not present

## 2015-07-07 ENCOUNTER — Ambulatory Visit: Payer: Self-pay | Admitting: Adult Health

## 2015-07-24 ENCOUNTER — Ambulatory Visit: Payer: Self-pay | Admitting: Adult Health

## 2015-07-28 ENCOUNTER — Ambulatory Visit: Payer: Self-pay | Admitting: Adult Health

## 2016-05-17 ENCOUNTER — Ambulatory Visit (INDEPENDENT_AMBULATORY_CARE_PROVIDER_SITE_OTHER): Payer: Medicare Other | Admitting: Adult Health

## 2016-05-17 ENCOUNTER — Encounter: Payer: Self-pay | Admitting: Adult Health

## 2016-05-17 VITALS — BP 124/80 | Temp 98.2°F | Ht 64.0 in | Wt 144.0 lb

## 2016-05-17 DIAGNOSIS — M542 Cervicalgia: Secondary | ICD-10-CM

## 2016-05-17 DIAGNOSIS — Z0000 Encounter for general adult medical examination without abnormal findings: Secondary | ICD-10-CM

## 2016-05-17 DIAGNOSIS — R0989 Other specified symptoms and signs involving the circulatory and respiratory systems: Secondary | ICD-10-CM

## 2016-05-17 DIAGNOSIS — R4189 Other symptoms and signs involving cognitive functions and awareness: Secondary | ICD-10-CM | POA: Diagnosis not present

## 2016-05-17 DIAGNOSIS — R6889 Other general symptoms and signs: Secondary | ICD-10-CM

## 2016-05-17 DIAGNOSIS — E785 Hyperlipidemia, unspecified: Secondary | ICD-10-CM

## 2016-05-17 DIAGNOSIS — G35 Multiple sclerosis: Secondary | ICD-10-CM

## 2016-05-17 DIAGNOSIS — Z23 Encounter for immunization: Secondary | ICD-10-CM | POA: Diagnosis not present

## 2016-05-17 DIAGNOSIS — I10 Essential (primary) hypertension: Secondary | ICD-10-CM | POA: Diagnosis not present

## 2016-05-17 LAB — CBC WITH DIFFERENTIAL/PLATELET
BASOS PCT: 0.5 % (ref 0.0–3.0)
Basophils Absolute: 0 10*3/uL (ref 0.0–0.1)
EOS PCT: 0.5 % (ref 0.0–5.0)
Eosinophils Absolute: 0 10*3/uL (ref 0.0–0.7)
HCT: 41.3 % (ref 36.0–46.0)
HEMOGLOBIN: 13.7 g/dL (ref 12.0–15.0)
LYMPHS ABS: 1.6 10*3/uL (ref 0.7–4.0)
Lymphocytes Relative: 33.3 % (ref 12.0–46.0)
MCHC: 33 g/dL (ref 30.0–36.0)
MCV: 79.9 fl (ref 78.0–100.0)
MONOS PCT: 6.6 % (ref 3.0–12.0)
Monocytes Absolute: 0.3 10*3/uL (ref 0.1–1.0)
Neutro Abs: 2.8 10*3/uL (ref 1.4–7.7)
Neutrophils Relative %: 59.1 % (ref 43.0–77.0)
Platelets: 305 10*3/uL (ref 150.0–400.0)
RBC: 5.18 Mil/uL — AB (ref 3.87–5.11)
RDW: 15.1 % (ref 11.5–15.5)
WBC: 4.7 10*3/uL (ref 4.0–10.5)

## 2016-05-17 LAB — BASIC METABOLIC PANEL
BUN: 16 mg/dL (ref 6–23)
CHLORIDE: 106 meq/L (ref 96–112)
CO2: 28 mEq/L (ref 19–32)
Calcium: 9.1 mg/dL (ref 8.4–10.5)
Creatinine, Ser: 0.55 mg/dL (ref 0.40–1.20)
GFR: 140.85 mL/min (ref >60.00)
GLUCOSE: 89 mg/dL (ref 70–99)
POTASSIUM: 3.9 meq/L (ref 3.5–5.1)
SODIUM: 143 meq/L (ref 135–145)

## 2016-05-17 LAB — LIPID PANEL
CHOLESTEROL: 235 mg/dL — AB (ref 0–200)
HDL: 91.9 mg/dL (ref >39.00)
LDL Cholesterol: 125 mg/dL — ABNORMAL HIGH (ref 0–99)
NonHDL: 143.1
Total CHOL/HDL Ratio: 3
Triglycerides: 89 mg/dL (ref 0.0–149.0)
VLDL: 17.8 mg/dL (ref 0.0–40.0)

## 2016-05-17 LAB — HEPATIC FUNCTION PANEL
ALBUMIN: 3.7 g/dL (ref 3.5–5.2)
ALT: 7 U/L (ref 0–35)
AST: 12 U/L (ref 0–37)
Alkaline Phosphatase: 67 U/L (ref 39–117)
Bilirubin, Direct: 0.2 mg/dL (ref 0.0–0.3)
Total Bilirubin: 1.1 mg/dL (ref 0.2–1.2)
Total Protein: 7.1 g/dL (ref 6.0–8.3)

## 2016-05-17 LAB — VITAMIN B12: VITAMIN B 12: 211 pg/mL (ref 211–911)

## 2016-05-17 LAB — TSH: TSH: 1.05 u[IU]/mL (ref 0.35–4.50)

## 2016-05-17 MED ORDER — CYCLOBENZAPRINE HCL 5 MG PO TABS: 5.0000 mg | ORAL_TABLET | Freq: Three times a day (TID) | ORAL | 1 refills | Status: DC | PRN

## 2016-05-17 NOTE — Progress Notes (Signed)
Patient presents to clinic today to establish care. She is a pleasant 70 year old female who  has a past medical history of Hyperlipidemia; Hypertension; MS (multiple sclerosis) (HCC); Tuberculosis; and Urinary incontinence. Her son Gregary SignsSean is with her at this visit and helps provide history   Her last physical was in 2013 with Dr. Lovell SheehanJenkins   Acute Concerns: Establish Care    Left sided neck pain - She reports that for the last month or so she has had a " crook in my neck". The pain does not radiate. She does not have an difficulty with ROM but does have discomfort with looking left. Denies any blurred vision   Chronic Issues: MS - Is not followed by anyone currently. 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 She has let her care expire with Neurology and as far back as 2013 she was advised to follow up with them. She has yet to do so. Her son reports that she is not always in a wheel chair ( she is today) but that someday's she has difficulty transferring and getting out of bed. She denies any   Cognitive Impairment.  - Her son Gregary SignsSean feels as though there has been memory issues for the last few years. Most noticeably she forgets where she has placed items around the house.   Heart Murmur and Gallop - She does not remember seeing Cardiology. She denies any CP or SOB on exertion.   Hypertension  - BP well controlled on no medications  Hyperlipidemia - No currently on any medications. She has not had labs checked since 2013.   Health Maintenance: Dental -- She does not do routine care  Vision --  Does not do routine care Immunizations -- Colonoscopy -- unknown  Mammogram -- 2013  PAP -- Non longer needs  Bone Density --   Is not followed by anyone   Past Medical History:  Diagnosis Date  . Hyperlipidemia   . Hypertension   . MS (multiple sclerosis) (HCC)   . Tuberculosis   . Urinary incontinence     Past Surgical History:  Procedure  Laterality Date  . ABDOMINAL HYSTERECTOMY    . FLEXIBLE SIGMOIDOSCOPY    . left breast biopsy    . TUBAL LIGATION      No current outpatient prescriptions on file prior to visit.   No current facility-administered medications on file prior to visit.     No Known Allergies  Family History  Problem Relation Age of Onset  . Cancer Mother     questionable   . Hypertension      Social History   Social History  . Marital status: Married    Spouse name: N/A  . Number of children: N/A  . Years of education: N/A   Occupational History  . Not on file.   Social History Main Topics  . Smoking status: Former Games developermoker  . Smokeless tobacco: Never Used  . Alcohol use No  . Drug use: No  . Sexual activity: Not on file   Other Topics Concern  . Not on file   Social History Narrative   Married for 44 years   Two boys       She worked at a bank for 20 years     Review of Systems  Constitutional: Negative.   HENT: Negative.   Eyes: Negative.   Respiratory: Negative.   Cardiovascular: Negative.   Gastrointestinal: Negative.   Genitourinary: Negative.  Musculoskeletal: Negative.   Skin: Negative.   Neurological: Negative.   Endo/Heme/Allergies: Negative.   Psychiatric/Behavioral: Negative.   All other systems reviewed and are negative.   BP 124/80   Temp 98.2 F (36.8 C) (Oral)   Ht 5\' 4"  (1.626 m)   Wt 144 lb (65.3 kg)   BMI 24.72 kg/m   Physical Exam  Constitutional: She is oriented to person, place, and time and well-developed, well-nourished, and in no distress. No distress.  HENT:  Head: Normocephalic and atraumatic.  Right Ear: External ear normal.  Left Ear: External ear normal.  Nose: Nose normal.  Mouth/Throat: Oropharynx is clear and moist. No oropharyngeal exudate.  Eyes: Conjunctivae and EOM are normal. Pupils are equal, round, and reactive to light. Right eye exhibits no discharge. Left eye exhibits no discharge. No scleral icterus.  Neck: Normal  range of motion. Neck supple. Carotid bruit is not present. No thyroid mass present.  Cardiovascular: Normal rate, regular rhythm and intact distal pulses.  Exam reveals gallop. Exam reveals no friction rub.   Murmur heard. Pulmonary/Chest: Effort normal and breath sounds normal. No respiratory distress. She has no wheezes. She has no rales. She exhibits no tenderness.  Musculoskeletal: Normal range of motion. She exhibits tenderness (to left scapula. No loss of ROM ). She exhibits no edema or deformity.  Lymphadenopathy:    She has no cervical adenopathy.  Neurological: She is alert and oriented to person, place, and time. She has normal reflexes. Gait normal. GCS score is 15.  Skin: Skin is warm and dry. No rash noted. She is not diaphoretic. No erythema. No pallor.  Psychiatric: Mood and affect normal. She exhibits disordered thought content.  She appears forgetful during this office visit but is alert and oriented x 4. She answers a lot of questions with " I don't know".   Nursing note and vitals reviewed.    Assessment/Plan:  1. Essential hypertension  - POCT Urinalysis Dipstick (Automated) - Basic metabolic panel - CBC with Differential/Platelet - Hepatic function panel - Lipid panel - TSH - Well controlled - Will continue to monitor  2. Hyperlipidemia, unspecified hyperlipidemia type  - POCT Urinalysis Dipstick (Automated) - Basic metabolic panel - CBC with Differential/Platelet - Hepatic function panel - Lipid panel - TSH - Consider adding statin  3. Cognitive impairment  - POCT Urinalysis Dipstick (Automated) - Basic metabolic panel - CBC with Differential/Platelet - Hepatic function panel - Lipid panel - TSH - Vitamin B12 - Ambulatory referral to Neurology  4. Need for vaccination with 13-polyvalent pneumococcal conjugate vaccine  - Pneumococcal conjugate vaccine 13-valent IM  5. Need for prophylactic vaccination and inoculation against influenza  - Flu  vaccine HIGH DOSE PF (Fluzone High Dose)  6. Neck pain - cyclobenzaprine (FLEXERIL) 5 MG tablet; Take 1 tablet (5 mg total) by mouth 3 (three) times daily as needed for muscle spasms.  Dispense: 30 tablet; Refill: 1 - Heating pack  - Tylenol 325mg  Q6 H pRN   7. Nonspecific abnormal finding on cardiac evaluation  - Ambulatory referral to Cardiology  8. SCLEROSIS, MULTIPLE  - Ambulatory referral to Neurology   Shirline Frees, AGNP

## 2016-05-17 NOTE — Patient Instructions (Signed)
It was great seeing you today!  Someone will call you to schedule the neurology and cardiology consults. It is important that you go to these  I will follow up with you regarding your blood work

## 2016-05-18 ENCOUNTER — Other Ambulatory Visit: Payer: Self-pay

## 2016-05-18 ENCOUNTER — Telehealth: Payer: Self-pay | Admitting: Adult Health

## 2016-05-18 DIAGNOSIS — M542 Cervicalgia: Secondary | ICD-10-CM

## 2016-05-18 MED ORDER — CYCLOBENZAPRINE HCL 5 MG PO TABS: 5.0000 mg | ORAL_TABLET | Freq: Three times a day (TID) | ORAL | 1 refills | Status: DC | PRN

## 2016-05-18 NOTE — Telephone Encounter (Signed)
Rx has been sent to preferred pharmacy.  

## 2016-05-18 NOTE — Telephone Encounter (Signed)
Pt needs the rx to be sent to [facility] [address] not [facility] [address]

## 2016-05-19 ENCOUNTER — Other Ambulatory Visit: Payer: Self-pay | Admitting: Adult Health

## 2016-05-19 MED ORDER — ATORVASTATIN CALCIUM 10 MG PO TABS: 10.0000 mg | ORAL_TABLET | Freq: Every day | ORAL | 3 refills | Status: DC

## 2016-06-15 ENCOUNTER — Encounter: Payer: Self-pay | Admitting: *Deleted

## 2016-06-30 ENCOUNTER — Encounter: Payer: Self-pay | Admitting: Internal Medicine

## 2016-06-30 ENCOUNTER — Ambulatory Visit (INDEPENDENT_AMBULATORY_CARE_PROVIDER_SITE_OTHER): Payer: Medicare Other | Admitting: Internal Medicine

## 2016-06-30 ENCOUNTER — Encounter (INDEPENDENT_AMBULATORY_CARE_PROVIDER_SITE_OTHER): Payer: Self-pay

## 2016-06-30 VITALS — BP 132/88 | HR 76 | Ht 64.5 in | Wt 145.0 lb

## 2016-06-30 DIAGNOSIS — R011 Cardiac murmur, unspecified: Secondary | ICD-10-CM | POA: Diagnosis not present

## 2016-06-30 DIAGNOSIS — I10 Essential (primary) hypertension: Secondary | ICD-10-CM | POA: Diagnosis not present

## 2016-06-30 DIAGNOSIS — M542 Cervicalgia: Secondary | ICD-10-CM

## 2016-06-30 NOTE — Progress Notes (Addendum)
New Outpatient Visit Date: 06/30/2016  Referring Provider: Shirline Freesory Nafziger, NP [address] [place], [place] [id number]  Chief Complaint: Neck pain  HPI:  Ms. Allison Bailey is a 70 y.o. year-old female with history of hypertension, hyperlipidemia, and multiple sclerosis, who has been referred by Dr. Evelene CroonNafziger for heart murmur and gallop noted on recent physical exam. Patient's only complaint today is of neck soreness that has been present for several months. She says it is worse on the left side. Turning her head in any direction significantly worsens the pain. She has tried acetaminophen with some temporary relief. Lying still in bed will at times make the pain resolved completely. She denies chest pain, arm pain, and back pain. She also has not had shortness of breath, orthopnea, PND, palpitations, or lightheadedness. She denies trauma to her head or neck. She notes occasional swelling of the right foot and ankle.  The patient's mobility is quite limited due to limb weakness (especially the left leg). She typically ambulates with a walker or moves around in a wheelchair. She has not fallen the last 3-4 years. She is currently not on any medications and has not seen a neurologist for management of her multiple sclerosis for several years. The patient denies a history of cardiovascular disease or prior cardiac evaluation.  --------------------------------------------------------------------------------------------------  Cardiovascular History & Procedures: Cardiovascular Problems:  Heart murmur  Risk Factors:  Hypertension, hyperlipidemia, age > 7165  Cath/PCI:  None  CV Surgery:  None  EP Procedures and Devices:  None  Non-Invasive Evaluation(s):  None  Recent CV Pertinent Labs: Lab Results  Component Value Date   CHOL 235 (H) 05/17/2016   HDL 91.90 05/17/2016   LDLCALC 125 (H) 05/17/2016   LDLDIRECT 95.4 04/04/2012   TRIG 89.0 05/17/2016   TRIG 50 07/11/2006   CHOLHDL  3 05/17/2016   K 3.9 60/45/409810/10/2015   BUN 16 05/17/2016   CREATININE 0.55 05/17/2016   --------------------------------------------------------------------------------------------------  Past Medical History:  Diagnosis Date  . Hyperlipidemia   . Hypertension   . MS (multiple sclerosis) (HCC)   . Tuberculosis   . Urinary incontinence     Past Surgical History:  Procedure Laterality Date  . ABDOMINAL HYSTERECTOMY    . FLEXIBLE SIGMOIDOSCOPY    . left breast biopsy    . TUBAL LIGATION      Outpatient Encounter Prescriptions as of 06/30/2016  Medication Sig  . [DISCONTINUED] atorvastatin (LIPITOR) 10 MG tablet Take 1 tablet (10 mg total) by mouth daily.  . [DISCONTINUED] cyclobenzaprine (FLEXERIL) 5 MG tablet Take 1 tablet (5 mg total) by mouth 3 (three) times daily as needed for muscle spasms.   No facility-administered encounter medications on file as of 06/30/2016.     Allergies: Patient has no known allergies.  Social History   Social History  . Marital status: Married    Spouse name: N/A  . Number of children: N/A  . Years of education: N/A   Occupational History  . Not on file.   Social History Main Topics  . Smoking status: Former Smoker    Packs/day: 2.00    Years: 20.00    Quit date: 1979  . Smokeless tobacco: Never Used  . Alcohol use No  . Drug use: No  . Sexual activity: Not on file   Other Topics Concern  . Not on file   Social History Narrative   Married for 44 years   Two boys       She worked at a bank for  20 years     Family History  Problem Relation Age of Onset  . Cancer Mother     questionable   . Hypertension    . Heart disease Neg Hx     Review of Systems: A 12-system review of systems was performed and was negative except as noted in the HPI.  --------------------------------------------------------------------------------------------------  Physical Exam: BP 132/88   Pulse 76   Ht 5' 4.5" (1.638 m)   Wt 145 lb (65.8  kg)   BMI 24.50 kg/m   General:  Frail woman seated in a wheelchair. She noticeably restricts movement of her head/neck. Her son and grandson accompany her today. HEENT: No conjunctival pallor or scleral icterus.  Moist mucous membranes.  OP clear. Neck: Musculature is somewhat tense. Patient restricts turning of her head. No lymphadenopathy or thyromegaly is appreciated. There is no JVD or HJR. Lungs: Normal work of breathing.  Clear to auscultation bilaterally without wheezes or crackles. Heart: Regular rate and rhythm with 1/6 systolic murmur loudest at left upper sternal border.  Normal S1/S2.  No rubs or gallops.  Non-displaced PMI. Abd: Bowel sounds present.  Soft, NT/ND without hepatosplenomegaly Ext: No lower extremity edema.  Radial, PT, and DP pulses are 2+ bilaterally Skin: warm and dry without rash Neuro: CNIII-XII intact.  Diminished strength in the lower extremities, left > right. Psych: Normal mood and affect.  EKG:  Normal sinus rhythm without significant abnormalities.  Lab Results  Component Value Date   WBC 4.7 05/17/2016   HGB 13.7 05/17/2016   HCT 41.3 05/17/2016   MCV 79.9 05/17/2016   PLT 305.0 05/17/2016    Lab Results  Component Value Date   NA 143 05/17/2016   K 3.9 05/17/2016   CL 106 05/17/2016   CO2 28 05/17/2016   BUN 16 05/17/2016   CREATININE 0.55 05/17/2016   GLUCOSE 89 05/17/2016   ALT 7 05/17/2016    Lab Results  Component Value Date   CHOL 235 (H) 05/17/2016   HDL 91.90 05/17/2016   LDLCALC 125 (H) 05/17/2016   LDLDIRECT 95.4 04/04/2012   TRIG 89.0 05/17/2016   CHOLHDL 3 05/17/2016   --------------------------------------------------------------------------------------------------  ASSESSMENT AND PLAN: Heart murmur A soft systolic murmur is appreciated on exam today. No rubs are auscultated. The patient does not have any findings to suggest severe valvular disease or heart failure. I suspect her murmur is innocent; we have agreed  to proceed with transthoracic echocardiogram for further characterization.  Neck pain The discomfort appears musculoskeletal. She has not had chest pain or other symptoms to suggest coronary insufficiency. I have encouraged the patient to follow-up with her PCP as well as neurologist, whom she will be seeing for the first time next month. In the meantime, she can take acetaminophen as needed for pain.  Hypertension Blood pressure is upper normal today. Given the significant pain involving her neck, will not initiate pharmacologic therapy today. Patient was encouraged to limit her salt intake.  Follow-up: Return to clinic follow-up as needed.  Yvonne Kendall, MD 06/30/2016 3:03 PM

## 2016-06-30 NOTE — Patient Instructions (Signed)
Medication Instructions:  Your physician recommends that you continue on your current medications as directed. Please refer to the Current Medication list given to you today.   Labwork: None   Testing/Procedures: Your physician has requested that you have an echocardiogram. Echocardiography is a painless test that uses sound waves to create images of your heart. It provides your doctor with information about the size and shape of your heart and how well your heart's chambers and valves are working. This procedure takes approximately one hour. There are no restrictions for this procedure.    Follow-Up: Your physician recommends that you schedule a follow-up appointment as needed with Dr End.         If you need a refill on your cardiac medications before your next appointment, please call your pharmacy.   

## 2016-07-18 ENCOUNTER — Ambulatory Visit (HOSPITAL_COMMUNITY)
Admission: RE | Admit: 2016-07-18 | Discharge: 2016-07-18 | Disposition: A | Discharge status: Home / Self Care | Payer: Medicare Other | Source: Ambulatory Visit | Attending: Internal Medicine | Admitting: Internal Medicine

## 2016-07-18 DIAGNOSIS — I351 Nonrheumatic aortic (valve) insufficiency: Secondary | ICD-10-CM | POA: Insufficient documentation

## 2016-07-18 DIAGNOSIS — R011 Cardiac murmur, unspecified: Secondary | ICD-10-CM

## 2016-07-18 LAB — ECHOCARDIOGRAM COMPLETE
EERAT: 8.49
EWDT: 250 ms
FS: 33 % (ref 28–44)
IVS/LV PW RATIO, ED: 0.88
LA diam end sys: 38 mm
LA diam index: 2.21 cm/m2
LA vol A4C: 35.3 ml
LA vol index: 25.8 mL/m2
LASIZE: 38 mm
LAVOL: 44.3 mL
LV E/e' medial: 8.49
LV PW d: 12.2 mm — AB (ref 0.6–1.1)
LV TDI E'LATERAL: 8.22
LV TDI E'MEDIAL: 5.8
LV e' LATERAL: 8.22 cm/s
LVEEAVG: 8.49
LVOT area: 3.46 cm2
LVOTD: 21 mm
MV Dec: 250
MV pk A vel: 105 m/s
MVPKEVEL: 69.8 m/s
P 1/2 time: 371 ms
RV LATERAL S' VELOCITY: 10 cm/s
TAPSE: 18.4 mm

## 2016-07-18 NOTE — Progress Notes (Signed)
  Echocardiogram 2D Echocardiogram has been performed.  Delcie Roch 07/18/2016, 12:56 PM

## 2016-07-19 ENCOUNTER — Encounter: Payer: Self-pay | Admitting: *Deleted

## 2016-08-01 ENCOUNTER — Ambulatory Visit: Payer: Medicare Other | Admitting: Neurology

## 2016-08-04 ENCOUNTER — Encounter: Payer: Self-pay | Admitting: Neurology

## 2017-08-29 ENCOUNTER — Ambulatory Visit: Payer: Medicare Other | Admitting: Adult Health

## 2017-09-07 ENCOUNTER — Telehealth: Payer: Self-pay | Admitting: General Practice

## 2017-09-07 NOTE — Telephone Encounter (Signed)
Called pt about her request to become a new pt with out practice. LVM advising pt to call back to schedule a new pt appt.

## 2017-09-29 ENCOUNTER — Telehealth: Payer: Self-pay

## 2017-09-29 NOTE — Telephone Encounter (Signed)
Called and spoke to her husband and did inform ok per previous PCP/new one as well. Scheduled new patient appt for Monday 10/02/2017 at 1:30--told to arrive no later than 1:15 to do paperwork/checkin He verbalized understanding.

## 2017-09-29 NOTE — Telephone Encounter (Signed)
Copied from CRM #[id number]. Topic: Appointment Scheduling - Prior Auth Required for Appointment >> Sep 29, 2017 11:58 AM Landry Mellow wrote: No appointment has been scheduled. Patient is requesting transfer from [facility] to Dr Carmelia Roller . Husband says that pt has MS and needs MD appointment. Per scheduling protocol, this appointment requires a prior authorization prior to scheduling. Cb for pt is [phone].  They need appt on Monday   Route to department's PEC pool.

## 2017-09-29 NOTE — Telephone Encounter (Signed)
I am fine with her switching. I only saw her once and that was back in Oct 2017

## 2017-09-29 NOTE — Telephone Encounter (Signed)
While I am happy to see her, I would like to set expectations. I do not manage MS and defer/refer to the Neurology team for a bulk of care related to it. I see that her current pcp has already referred her to one. Again, she is welcome to establish though.  TY.

## 2017-09-29 NOTE — Telephone Encounter (Signed)
Pt's husband requesting transfer from [facility] to Dr. Carmelia Roller (Pt has MS and would like MD or DO). Please advise.

## 2017-10-02 ENCOUNTER — Encounter: Payer: Self-pay | Admitting: Neurology

## 2017-10-02 ENCOUNTER — Ambulatory Visit: Payer: Medicare Other | Admitting: Family Medicine

## 2017-10-02 ENCOUNTER — Encounter: Payer: Self-pay | Admitting: Family Medicine

## 2017-10-02 VITALS — BP 128/70 | HR 77 | Temp 98.4°F | Ht 64.5 in | Wt 161.0 lb

## 2017-10-02 DIAGNOSIS — R011 Cardiac murmur, unspecified: Secondary | ICD-10-CM | POA: Diagnosis not present

## 2017-10-02 DIAGNOSIS — G35 Multiple sclerosis: Secondary | ICD-10-CM

## 2017-10-02 MED ORDER — TIZANIDINE HCL 2 MG PO TABS: 2.0000 mg | ORAL_TABLET | Freq: Three times a day (TID) | ORAL | 2 refills | Status: DC | PRN

## 2017-10-02 NOTE — Patient Instructions (Addendum)
If you do not hear anything about your neurology referral in the next 1-2 weeks, call our office and ask for an update.  Stay well hydrated!  Stretch/Yoga can be helpful. YouTube has good videos .  Heat (pad or rice pillow in microwave) over affected area, 10-15 minutes twice daily.   Let us know if you need anything.

## 2017-10-02 NOTE — Progress Notes (Signed)
Chief Complaint  Patient presents with  . Establish Care       New Patient Visit SUBJECTIVE: HPI: Allison Bailey is an 72 y.o.female who is being seen for establishing care.  The patient was previously seen at [facility]. Here with son today.  Hx of MS. No current meds or specialists. Has been having muscle spasms. She uses a walker for ambulation normally. PO intake of water is poor. She does not stretch or do any exercises. Interested in getting back in with Neurology team.   I also heard a murmur on exam. She is not having any CP or SOB. She saw the cardiology team at the end of 2017 and had a neg EKG, Echo was to be done but never completed.   No Known Allergies  Past Medical History:  Diagnosis Date  . Hyperlipidemia   . Hypertension   . MS (multiple sclerosis) (HCC)   . Tuberculosis   . Urinary incontinence    Past Surgical History:  Procedure Laterality Date  . ABDOMINAL HYSTERECTOMY    . FLEXIBLE SIGMOIDOSCOPY    . left breast biopsy    . TUBAL LIGATION     Social History   Socioeconomic History  . Marital status: Married  Tobacco Use  . Smoking status: Former Smoker    Packs/day: 2.00    Years: 20.00    Pack years: 40.00    Last attempt to quit: 1979    Years since quitting: 40.1  . Smokeless tobacco: Never Used  Substance and Sexual Activity  . Alcohol use: No  . Drug use: No  . Sexual activity: Not on file  Other Topics Concern  . Not on file  Social History Narrative   Married for 44 years   Two boys       She worked at a bank for 20 years    Family History  Problem Relation Age of Onset  . Cancer Mother        questionable   . Hypertension Unknown   . Heart disease Neg Hx    Takes no meds routinely.  No LMP recorded. Patient has had a hysterectomy.  ROS Cardiovascular: Denies chest pain  Respiratory: Denies dyspnea   OBJECTIVE: BP 128/70 (BP Location: Left Arm, Patient Position: Sitting, Cuff Size: Normal)   Pulse 77   Temp 98.4 F  (36.9 C) (Oral)   Ht 5' 4.5" (1.638 m)   Wt 161 lb (73 kg)   SpO2 99%   BMI 27.21 kg/m   Constitutional: -  VS reviewed -  Well developed, well nourished, appears stated age -  No apparent distress  Psychiatric: -  Oriented to person, place, and time -  Memory intact -  Affect and mood normal -  Fluent conversation, good eye contact -  Judgment and insight age appropriate  Eye: -  Conjunctivae clear, no discharge -  Pupils symmetric, round, reactive to light  ENMT: -  MMM    Pharynx moist, no exudate, no erythema  Neck: -  No gross swelling, no palpable masses -  Thyroid midline, not enlarged, mobile, no palpable masses  Cardiovascular: -  RRR -  +SEM heard loudest at aortic listening post -  +LLE edema, pitting -  No LE edema  Respiratory: -  Normal respiratory effort, no accessory muscle use, no retraction -  Breath sounds equal, no wheezes, no ronchi, no crackles  Skin: -  No significant lesion on inspection -  Warm and dry to palpation  ASSESSMENT/PLAN: Multiple sclerosis (HCC) - Plan: tiZANidine (ZANAFLEX) 2 MG tablet, Ambulatory referral to Neurology  Heart murmur - Plan: ECHOCARDIOGRAM COMPLETE  Refer to Neuro, try Zanaflex. Yoga, stretching, heat, increase water intake.  Ck Echo for murmur. She is asymptomatic, cardiology team was not particularly worried, but they also wanted this study. Patient should return in 6 mo for CPE. The patient and her son voiced understanding and agreement to the plan.   Jilda Roche [place], DO 10/02/17  2:12 PM

## 2017-10-02 NOTE — Progress Notes (Signed)
Pre visit review using our clinic review tool, if applicable. No additional management support is needed unless otherwise documented below in the visit note. 

## 2017-10-13 ENCOUNTER — Telehealth: Payer: Self-pay | Admitting: Family Medicine

## 2017-10-13 DIAGNOSIS — G35 Multiple sclerosis: Secondary | ICD-10-CM

## 2017-10-13 NOTE — Addendum Note (Signed)
Addended by: Scharlene Gloss B on: 10/13/2017 01:36 PM   Modules accepted: Orders

## 2017-10-13 NOTE — Telephone Encounter (Signed)
OK 

## 2017-10-13 NOTE — Telephone Encounter (Signed)
Copied from CRM [id number]. Topic: General - Other >> Oct 13, 2017  9:30 AM Percival Spanish wrote:  Pt husband call to say would like a RX for a wheelchair sent to [facility]  fax number [phone]  and to please fup with pt husband [phone]

## 2017-10-13 NOTE — Telephone Encounter (Signed)
Printed prescription and called the family member to to let know we have faxed to [facility]

## 2017-10-13 NOTE — Telephone Encounter (Signed)
Let me know and will do

## 2017-10-16 NOTE — Telephone Encounter (Signed)
Husband states he spoke with [facility]  today and they did not receive the fax for the pts  w/c. He is requesting it be faxed again and he would like a call once this is complete.

## 2017-10-16 NOTE — Telephone Encounter (Signed)
Faxed prescription again.

## 2017-10-20 ENCOUNTER — Telehealth: Payer: Self-pay | Admitting: Family Medicine

## 2017-10-20 NOTE — Telephone Encounter (Signed)
Copied from CRM [id number]. Topic: Quick Communication - See Telephone Encounter >> Oct 20, 2017  1:59 PM Lelon Frohlich, RMA wrote: CRM for notification. See Telephone encounter for:   10/20/17.[facility] needs script and notes in order to process request for wheelchair according to pt husband [facility] told husband that they have faxed request to office multiple times Please call [phone]

## 2017-10-20 NOTE — Telephone Encounter (Signed)
I have checked with the employee who does paperwork and we have not received this. Called the number in message left  Detailed message we have not received this paperwork and to please have [facility] refax to our number [phone]

## 2017-10-20 NOTE — Telephone Encounter (Signed)
Paper work/prescription has been faxed to [facility] again.

## 2017-10-26 ENCOUNTER — Other Ambulatory Visit (HOSPITAL_BASED_OUTPATIENT_CLINIC_OR_DEPARTMENT_OTHER): Payer: Medicare Other

## 2017-10-30 ENCOUNTER — Ambulatory Visit (HOSPITAL_BASED_OUTPATIENT_CLINIC_OR_DEPARTMENT_OTHER)
Admission: RE | Admit: 2017-10-30 | Discharge: 2017-10-30 | Disposition: A | Discharge status: Home / Self Care | Payer: Medicare Other | Source: Ambulatory Visit | Attending: Family Medicine | Admitting: Family Medicine

## 2017-10-30 DIAGNOSIS — R011 Cardiac murmur, unspecified: Secondary | ICD-10-CM

## 2017-10-30 DIAGNOSIS — I351 Nonrheumatic aortic (valve) insufficiency: Secondary | ICD-10-CM | POA: Diagnosis not present

## 2017-10-30 DIAGNOSIS — E785 Hyperlipidemia, unspecified: Secondary | ICD-10-CM | POA: Insufficient documentation

## 2017-10-30 DIAGNOSIS — I10 Essential (primary) hypertension: Secondary | ICD-10-CM | POA: Insufficient documentation

## 2017-10-30 NOTE — Progress Notes (Signed)
Echocardiogram 2D Echocardiogram has been performed.  Dorothey Baseman 10/30/2017, 11:26 AM

## 2017-11-03 DIAGNOSIS — G35 Multiple sclerosis: Secondary | ICD-10-CM | POA: Diagnosis not present

## 2017-12-04 DIAGNOSIS — G35 Multiple sclerosis: Secondary | ICD-10-CM | POA: Diagnosis not present

## 2017-12-08 ENCOUNTER — Encounter: Payer: Self-pay | Admitting: Neurology

## 2018-01-03 DIAGNOSIS — G35 Multiple sclerosis: Secondary | ICD-10-CM | POA: Diagnosis not present

## 2018-01-22 ENCOUNTER — Ambulatory Visit: Payer: Medicare Other | Admitting: Neurology

## 2018-02-03 DIAGNOSIS — G35 Multiple sclerosis: Secondary | ICD-10-CM | POA: Diagnosis not present

## 2018-03-05 DIAGNOSIS — G35 Multiple sclerosis: Secondary | ICD-10-CM | POA: Diagnosis not present

## 2018-03-15 ENCOUNTER — Encounter

## 2018-04-02 ENCOUNTER — Ambulatory Visit (INDEPENDENT_AMBULATORY_CARE_PROVIDER_SITE_OTHER): Payer: Medicare Other | Admitting: Family Medicine

## 2018-04-02 ENCOUNTER — Encounter: Payer: Self-pay | Admitting: Family Medicine

## 2018-04-02 VITALS — BP 140/82 | HR 71 | Temp 98.1°F | Wt 137.0 lb

## 2018-04-02 DIAGNOSIS — E348 Other specified endocrine disorders: Secondary | ICD-10-CM | POA: Diagnosis not present

## 2018-04-02 DIAGNOSIS — Z0000 Encounter for general adult medical examination without abnormal findings: Secondary | ICD-10-CM | POA: Diagnosis not present

## 2018-04-02 DIAGNOSIS — Z23 Encounter for immunization: Secondary | ICD-10-CM

## 2018-04-02 DIAGNOSIS — G35 Multiple sclerosis: Secondary | ICD-10-CM

## 2018-04-02 DIAGNOSIS — Z1239 Encounter for other screening for malignant neoplasm of breast: Secondary | ICD-10-CM | POA: Insufficient documentation

## 2018-04-02 DIAGNOSIS — Z1231 Encounter for screening mammogram for malignant neoplasm of breast: Secondary | ICD-10-CM

## 2018-04-02 DIAGNOSIS — Z1159 Encounter for screening for other viral diseases: Secondary | ICD-10-CM | POA: Diagnosis not present

## 2018-04-02 MED ORDER — TIZANIDINE HCL 2 MG PO TABS: 2.0000 mg | ORAL_TABLET | Freq: Three times a day (TID) | ORAL | 1 refills | Status: DC | PRN

## 2018-04-02 NOTE — Addendum Note (Signed)
Addended by: Scharlene Gloss B on: 04/02/2018 11:04 AM   Modules accepted: Orders

## 2018-04-02 NOTE — Progress Notes (Deleted)
NEUROLOGY CONSULTATION NOTE  Allison Bailey MRN: [id number] DOB: 07-Oct-1945  Referring provider: Sharlene Dory, DO Primary care provider: Sharlene Dory, DO  Reason for consult:  Multiple sclerosis  HISTORY OF PRESENT ILLNESS: Allison Bailey is a 72 year old right-handed African American female with multiple sclerosis, hypertension, hyperlipidemia and history of TB who presents for multiple sclerosis and cognitive impairment.  History supplemented by referring provider's note.  ***  Current disease modifying therapy:  *** Past disease modifying therapy:  Betaseron  Current medication:  *** Past medication:  ***  Vision:  *** Motor:  *** Sensory:  *** Pain:  *** Gait:  *** Bowel/bladder:  Neurogenic bladder and bowel with increased urinary frequency and fecal urgency. Fatigue:  *** Cognition:  Cognitive impairment.  *** Mood:  ***  Most recent MRI of brain with and without contrast from 05/01/12 demonstrated hyperintense T2 and FLAIR lesions in the periventricular, subcortical, and brainstem white matter, as well as T1 black holes and atrophy of the corpus callosum.  No abnormal enhancement to indicate active disease was seen.  She has no recent labs available.  PAST MEDICAL HISTORY: Past Medical History:  Diagnosis Date  . Hyperlipidemia   . Hypertension   . MS (multiple sclerosis) (HCC)   . Tuberculosis     PAST SURGICAL HISTORY: Past Surgical History:  Procedure Laterality Date  . ABDOMINAL HYSTERECTOMY    . FLEXIBLE SIGMOIDOSCOPY    . left breast biopsy    . TUBAL LIGATION      MEDICATIONS: Current Outpatient Medications on File Prior to Visit  Medication Sig Dispense Refill  . tiZANidine (ZANAFLEX) 2 MG tablet Take 1 tablet (2 mg total) by mouth every 8 (eight) hours as needed for muscle spasms. 30 tablet 2   No current facility-administered medications on file prior to visit.     ALLERGIES: No Known Allergies  FAMILY  HISTORY: Family History  Problem Relation Age of Onset  . Cancer Mother        questionable   . Hypertension Unknown   . Heart disease Neg Hx    ***.  SOCIAL HISTORY: Social History   Socioeconomic History  . Marital status: Married    Spouse name: Not on file  . Number of children: Not on file  . Years of education: Not on file  . Highest education level: Not on file  Occupational History  . Not on file  Social Needs  . Financial resource strain: Not on file  . Food insecurity:    Worry: Not on file    Inability: Not on file  . Transportation needs:    Medical: Not on file    Non-medical: Not on file  Tobacco Use  . Smoking status: Former Smoker    Packs/day: 2.00    Years: 20.00    Pack years: 40.00    Last attempt to quit: 1979    Years since quitting: 40.6  . Smokeless tobacco: Never Used  Substance and Sexual Activity  . Alcohol use: No  . Drug use: No  . Sexual activity: Not on file  Lifestyle  . Physical activity:    Days per week: Not on file    Minutes per session: Not on file  . Stress: Not on file  Relationships  . Social connections:    Talks on phone: Not on file    Gets together: Not on file    Attends religious service: Not on file    Active member of  club or organization: Not on file    Attends meetings of clubs or organizations: Not on file    Relationship status: Not on file  . Intimate partner violence:    Fear of current or ex partner: Not on file    Emotionally abused: Not on file    Physically abused: Not on file    Forced sexual activity: Not on file  Other Topics Concern  . Not on file  Social History Narrative   Married for 44 years   Two boys       She worked at a bank for 20 years     REVIEW OF SYSTEMS: Constitutional: No fevers, chills, or sweats, no generalized fatigue, change in appetite Eyes: No visual changes, double vision, eye pain Ear, nose and throat: No hearing loss, ear pain, nasal congestion, sore  throat Cardiovascular: No chest pain, palpitations Respiratory:  No shortness of breath at rest or with exertion, wheezes GastrointestinaI: No nausea, vomiting, diarrhea, abdominal pain, fecal incontinence Genitourinary:  No dysuria, urinary retention or frequency Musculoskeletal:  No neck pain, back pain Integumentary: No rash, pruritus, skin lesions Neurological: as above Psychiatric: No depression, insomnia, anxiety Endocrine: No palpitations, fatigue, diaphoresis, mood swings, change in appetite, change in weight, increased thirst Hematologic/Lymphatic:  No purpura, petechiae. Allergic/Immunologic: no itchy/runny eyes, nasal congestion, recent allergic reactions, rashes  PHYSICAL EXAM: There were no vitals filed for this visit. General: No acute distress.  Patient appears ***-groomed.  *** Head:  Normocephalic/atraumatic Eyes:  fundi examined but not visualized Neck: supple, no paraspinal tenderness, full range of motion Back: No paraspinal tenderness Heart: regular rate and rhythm Lungs: Clear to auscultation bilaterally. Vascular: No carotid bruits. Neurological Exam: Mental status: alert and oriented to person, place, and time, recent and remote memory intact, fund of knowledge intact, attention and concentration intact, speech fluent and not dysarthric, language intact. Cranial nerves: CN I: not tested CN II: pupils equal, round and reactive to light, visual fields intact CN III, IV, VI:  full range of motion, no nystagmus, no ptosis CN V: facial sensation intact CN VII: upper and lower face symmetric CN VIII: hearing intact CN IX, X: gag intact, uvula midline CN XI: sternocleidomastoid and trapezius muscles intact CN XII: tongue midline Bulk & Tone: normal, no fasciculations. Motor:  5/5 throughout *** Sensation:  Pinprick *** temperature *** and vibration sensation intact.  ***. Deep Tendon Reflexes:  2+ throughout, *** toes downgoing.  *** Finger to nose testing:   Without dysmetria.  *** Heel to shin:  Without dysmetria.  *** Gait:  Normal station and stride.  Able to turn and tandem walk. Romberg ***.  IMPRESSION: ***  PLAN: ***  Thank you for allowing me to take part in the care of this patient.  Shon Millet, DO  CC: ***

## 2018-04-02 NOTE — Patient Instructions (Addendum)
OK to use Debrox (peroxide) in the ear to loosen up wax. Also recommend using a bulb syringe (for removing boogers from baby's noses) to flush through warm water. Do not use Q-tips as this can impact wax further.  Give [place] 2-3 business days to get the results of your labs back.   Let us know if you would like the Shingles vaccine. It can make you achy and low energy for 48 hours afterwards so plan accordingly.   Use the medication if you need it. Let me know if you need a refill prior to your next appointment.  Elevate your legs, mind the salt intake, stay active. Try compression stockings if no help. Come to see [place] if we are still having issues.   Let us know if you need anything.

## 2018-04-02 NOTE — Progress Notes (Signed)
Chief Complaint  Patient presents with  . Annual Exam     Well Woman Allison Bailey is here for a complete physical. Here with son and daughter-in-law.  Her last physical was >1 year ago.  Current diet: in general, a "pretty healthy" diet. Likes Pepsi and cookies.  Current exercise: none. Weight is stable and shedenies daytime fatigue. No LMP recorded. Patient has had a hysterectomy. Seatbelt? Yes  Health Maintenance Colonoscopy- Yes- Unsure exactly  Shingrix- No  Lung cancer screening- N/A DEXA- No  Mammogram- No Tetanus- No Pneumonia- Prevnar done, needs 23 Hep C screen- No  Past Medical History:  Diagnosis Date  . Hyperlipidemia   . Hypertension   . MS (multiple sclerosis) (HCC)   . Tuberculosis      Past Surgical History:  Procedure Laterality Date  . ABDOMINAL HYSTERECTOMY    . FLEXIBLE SIGMOIDOSCOPY    . left breast biopsy    . TUBAL LIGATION      Medications  Current Outpatient Medications on File Prior to Visit  Medication Sig Dispense Refill  . tiZANidine (ZANAFLEX) 2 MG tablet Take 1 tablet (2 mg total) by mouth every 8 (eight) hours as needed for muscle spasms. 30 tablet 2    Allergies No Known Allergies  Review of Systems: Constitutional:  no sweats Eye:  no recent significant change in vision Ear/Nose/Mouth/Throat:  Ears:  No changes in hearing Nose/Mouth/Throat:  no complaints of nasal congestion, no sore throat Cardiovascular: no chest pain, +swelling in legs Respiratory:  No cough and no shortness of breath Gastrointestinal:  no abdominal pain, no change in bowel habits GU:  Female: negative for dysuria or pelvic pain Musculoskeletal/Extremities:  no pain of the joints Integumentary (Skin/Breast):  no abnormal skin lesions reported Neurologic:  no headaches Psychiatric:  no anxiety, no depression Endocrine:  denies unexplained weight changes  Exam BP 140/82 (BP Location: Left Arm, Patient Position: Sitting, Cuff Size: Normal)   Pulse  71   Temp 98.1 F (36.7 C) (Oral)   Wt 137 lb (62.1 kg)   SpO2 97%   BMI 23.15 kg/m  General:  well developed, well nourished, in no apparent distress Skin:  no significant moles, warts, or growths Head:  no masses, lesions, or tenderness Eyes:  pupils equal and round, sclera anicteric without injection Ears:  canals without lesions, TMs shiny without retraction, no obvious effusion, no erythema Nose:  nares patent, septum midline, mucosa normal, and no drainage or sinus tenderness Throat/Pharynx:  lips and gingiva without lesion; tongue and uvula midline; non-inflamed pharynx; no exudates or postnasal drainage Neck: neck supple without adenopathy, thyromegaly, or masses Lungs:  clear to auscultation, breath sounds equal bilaterally, no respiratory distress Cardio:  regular rate and rhythm, no bruits, 1+ pitting edema on R, 2+ pitting LE edema on L, both taper off at knees.  Abdomen:  abdomen soft, nontender; bowel sounds normal; no masses or organomegaly Genital: Deferred Musculoskeletal:  symmetrical muscle groups noted without atrophy or deformity, no calf pain Extremities:  no clubbing, cyanosis, no deformities, no skin discoloration Neuro:  gait normal; deep tendon reflexes normal and symmetric Psych: well oriented with normal range of affect and appropriate judgment/insight  Assessment and Plan  Well adult exam - Plan: Comprehensive metabolic panel, Lipid panel  Multiple sclerosis (HCC) - Plan: tiZANidine (ZANAFLEX) 2 MG tablet  Encounter for hepatitis C screening test for low risk patient - Plan: Hepatitis C antibody  Estradiol deficiency - Plan: DG Bone Density  Screening for breast cancer -  Plan: MM DIGITAL SCREENING BILATERAL   [name]. Counseled on diet and exercise. Moderation for sweets. Try to stay active. She is doing some walking. Other orders as above. Update imms. Will think about Shingles and will get me info on last colonoscopy. Follow up in 1  year pending the above workup. The patient voiced understanding and agreement to the plan.  Jilda Roche Solana, DO 04/02/18 11:00 AM

## 2018-04-03 ENCOUNTER — Ambulatory Visit: Payer: Medicare Other | Admitting: Neurology

## 2018-04-05 DIAGNOSIS — G35 Multiple sclerosis: Secondary | ICD-10-CM | POA: Diagnosis not present

## 2018-05-06 DIAGNOSIS — G35 Multiple sclerosis: Secondary | ICD-10-CM | POA: Diagnosis not present

## 2018-05-25 ENCOUNTER — Ambulatory Visit: Payer: Medicare Other | Admitting: Neurology

## 2018-06-05 DIAGNOSIS — G35 Multiple sclerosis: Secondary | ICD-10-CM | POA: Diagnosis not present

## 2018-06-26 ENCOUNTER — Other Ambulatory Visit: Payer: Self-pay

## 2018-06-26 ENCOUNTER — Encounter (HOSPITAL_BASED_OUTPATIENT_CLINIC_OR_DEPARTMENT_OTHER): Payer: Self-pay | Admitting: *Deleted

## 2018-06-26 ENCOUNTER — Ambulatory Visit: Payer: Self-pay | Admitting: *Deleted

## 2018-06-26 ENCOUNTER — Inpatient Hospital Stay (HOSPITAL_BASED_OUTPATIENT_CLINIC_OR_DEPARTMENT_OTHER)
Admission: EM | Admit: 2018-06-26 | Discharge: 2018-06-29 | DRG: 689 | Disposition: A | Discharge status: Home Health | Payer: Medicare Other | Attending: Internal Medicine | Admitting: Internal Medicine

## 2018-06-26 ENCOUNTER — Emergency Department (HOSPITAL_BASED_OUTPATIENT_CLINIC_OR_DEPARTMENT_OTHER): Payer: Medicare Other

## 2018-06-26 DIAGNOSIS — R29706 NIHSS score 6: Secondary | ICD-10-CM | POA: Diagnosis not present

## 2018-06-26 DIAGNOSIS — E785 Hyperlipidemia, unspecified: Secondary | ICD-10-CM | POA: Diagnosis present

## 2018-06-26 DIAGNOSIS — Z79899 Other long term (current) drug therapy: Secondary | ICD-10-CM

## 2018-06-26 DIAGNOSIS — Z87891 Personal history of nicotine dependence: Secondary | ICD-10-CM | POA: Diagnosis not present

## 2018-06-26 DIAGNOSIS — N390 Urinary tract infection, site not specified: Principal | ICD-10-CM | POA: Diagnosis present

## 2018-06-26 DIAGNOSIS — N3001 Acute cystitis with hematuria: Secondary | ICD-10-CM | POA: Diagnosis not present

## 2018-06-26 DIAGNOSIS — I639 Cerebral infarction, unspecified: Secondary | ICD-10-CM | POA: Diagnosis not present

## 2018-06-26 DIAGNOSIS — G35 Multiple sclerosis: Secondary | ICD-10-CM | POA: Diagnosis present

## 2018-06-26 DIAGNOSIS — Z8673 Personal history of transient ischemic attack (TIA), and cerebral infarction without residual deficits: Secondary | ICD-10-CM

## 2018-06-26 DIAGNOSIS — I10 Essential (primary) hypertension: Secondary | ICD-10-CM | POA: Diagnosis not present

## 2018-06-26 DIAGNOSIS — R4189 Other symptoms and signs involving cognitive functions and awareness: Secondary | ICD-10-CM | POA: Diagnosis present

## 2018-06-26 DIAGNOSIS — E876 Hypokalemia: Secondary | ICD-10-CM | POA: Diagnosis not present

## 2018-06-26 DIAGNOSIS — Z8611 Personal history of tuberculosis: Secondary | ICD-10-CM

## 2018-06-26 DIAGNOSIS — R531 Weakness: Secondary | ICD-10-CM | POA: Insufficient documentation

## 2018-06-26 DIAGNOSIS — I6381 Other cerebral infarction due to occlusion or stenosis of small artery: Secondary | ICD-10-CM | POA: Diagnosis not present

## 2018-06-26 DIAGNOSIS — I351 Nonrheumatic aortic (valve) insufficiency: Secondary | ICD-10-CM | POA: Diagnosis not present

## 2018-06-26 HISTORY — DX: Cerebral infarction, unspecified: I63.9

## 2018-06-26 LAB — CBC
HCT: 36.4 % (ref 36.0–46.0)
Hemoglobin: 11.1 g/dL — ABNORMAL LOW (ref 12.0–15.0)
MCH: 25 pg — ABNORMAL LOW (ref 26.0–34.0)
MCHC: 30.5 g/dL (ref 30.0–36.0)
MCV: 82 fL (ref 80.0–100.0)
NRBC: 0 % (ref 0.0–0.2)
PLATELETS: 241 10*3/uL (ref 150–400)
RBC: 4.44 MIL/uL (ref 3.87–5.11)
RDW: 14.7 % (ref 11.5–15.5)
WBC: 7.2 10*3/uL (ref 4.0–10.5)

## 2018-06-26 LAB — URINALYSIS, ROUTINE W REFLEX MICROSCOPIC
BILIRUBIN URINE: NEGATIVE
GLUCOSE, UA: NEGATIVE mg/dL
Ketones, ur: >80 mg/dL — AB
Nitrite: POSITIVE — AB
Protein, ur: NEGATIVE mg/dL
pH: 6 (ref 5.0–8.0)

## 2018-06-26 LAB — CBC WITH DIFFERENTIAL/PLATELET
ABS IMMATURE GRANULOCYTES: 0.01 10*3/uL (ref 0.00–0.07)
Basophils Absolute: 0 10*3/uL (ref 0.0–0.1)
Basophils Relative: 0 %
EOS PCT: 0 %
Eosinophils Absolute: 0 10*3/uL (ref 0.0–0.5)
HEMATOCRIT: 42.8 % (ref 36.0–46.0)
Hemoglobin: 12.6 g/dL (ref 12.0–15.0)
Immature Granulocytes: 0 %
LYMPHS ABS: 1.3 10*3/uL (ref 0.7–4.0)
LYMPHS PCT: 15 %
MCH: 24.9 pg — ABNORMAL LOW (ref 26.0–34.0)
MCHC: 29.4 g/dL — ABNORMAL LOW (ref 30.0–36.0)
MCV: 84.4 fL (ref 80.0–100.0)
MONOS PCT: 9 %
Monocytes Absolute: 0.7 10*3/uL (ref 0.1–1.0)
NEUTROS ABS: 6.5 10*3/uL (ref 1.7–7.7)
Neutrophils Relative %: 76 %
Platelets: 276 10*3/uL (ref 150–400)
RBC: 5.07 MIL/uL (ref 3.87–5.11)
RDW: 14.8 % (ref 11.5–15.5)
WBC: 8.6 10*3/uL (ref 4.0–10.5)
nRBC: 0 % (ref 0.0–0.2)

## 2018-06-26 LAB — BASIC METABOLIC PANEL
ANION GAP: 8 (ref 5–15)
BUN: 15 mg/dL (ref 8–23)
CO2: 25 mmol/L (ref 22–32)
Calcium: 8.9 mg/dL (ref 8.9–10.3)
Chloride: 108 mmol/L (ref 98–111)
Creatinine, Ser: 0.64 mg/dL (ref 0.44–1.00)
GFR calc non Af Amer: >60 mL/min (ref >60)
Glucose, Bld: 85 mg/dL (ref 70–99)
POTASSIUM: 3.3 mmol/L — AB (ref 3.5–5.1)
Sodium: 141 mmol/L (ref 135–145)

## 2018-06-26 LAB — URINALYSIS, MICROSCOPIC (REFLEX)

## 2018-06-26 LAB — CREATININE, SERUM
Creatinine, Ser: 0.65 mg/dL (ref 0.44–1.00)
GFR calc non Af Amer: >60 mL/min (ref >60)

## 2018-06-26 MED ORDER — ACETAMINOPHEN 650 MG RE SUPP: 650.0000 mg | Freq: Four times a day (QID) | RECTAL | Status: DC | PRN

## 2018-06-26 MED ORDER — ENOXAPARIN SODIUM 40 MG/0.4ML ~~LOC~~ SOLN
40.0000 mg | SUBCUTANEOUS | Status: DC
  Administered 2018-06-26 – 2018-06-28 (×3): 40 mg via SUBCUTANEOUS
  Filled 2018-06-26 (×3): qty 0.4

## 2018-06-26 MED ORDER — WHITE PETROLATUM EX OINT
TOPICAL_OINTMENT | CUTANEOUS | Status: AC
  Administered 2018-06-26: 0.2
  Filled 2018-06-26: qty 28.35

## 2018-06-26 MED ORDER — POTASSIUM CHLORIDE 20 MEQ PO PACK
40.0000 meq | PACK | Freq: Once | ORAL | Status: AC
  Administered 2018-06-26: 40 meq via ORAL
  Filled 2018-06-26: qty 2

## 2018-06-26 MED ORDER — ONDANSETRON HCL 4 MG PO TABS: 4.0000 mg | ORAL_TABLET | Freq: Four times a day (QID) | ORAL | Status: DC | PRN

## 2018-06-26 MED ORDER — ONDANSETRON HCL 4 MG/2ML IJ SOLN: 4.0000 mg | Freq: Four times a day (QID) | INTRAMUSCULAR | Status: DC | PRN

## 2018-06-26 MED ORDER — SODIUM CHLORIDE 0.9 % IV SOLN
1.0000 g | INTRAVENOUS | Status: DC
  Administered 2018-06-26 – 2018-06-28 (×4): 1 g via INTRAVENOUS
  Filled 2018-06-26 (×3): qty 10

## 2018-06-26 MED ORDER — SODIUM CHLORIDE 0.9 % IV SOLN
1.0000 g | Freq: Once | INTRAVENOUS | Status: AC
  Administered 2018-06-26: 1 g via INTRAVENOUS
  Filled 2018-06-26: qty 10

## 2018-06-26 MED ORDER — ACETAMINOPHEN 325 MG PO TABS: 650.0000 mg | ORAL_TABLET | Freq: Four times a day (QID) | ORAL | Status: DC | PRN

## 2018-06-26 MED ORDER — POLYETHYLENE GLYCOL 3350 17 G PO PACK: 17.0000 g | PACK | Freq: Every day | ORAL | Status: DC | PRN

## 2018-06-26 NOTE — ED Triage Notes (Signed)
Pt has been leaning to the right side and having  Right arm weakness for the past week. She has also had a decline in self feeding. Pts son stated that she has done a little better in the past 2 days.

## 2018-06-26 NOTE — Telephone Encounter (Signed)
Pt's husband called stating that his wife (the patient) is having some weakness on her right side. She has a hx of MS. She is stating that she has no strength on the side. Her right leg is swollen and she has sharp pains in it. She is not able to get up without assistance and have to hold her up on the commode. She also has numbness in her right arm. He just noticed the swelling yesterday.  She only takes Zanaflex as needed. She has not eaten anything today or last evening. He will take her to the nearest ED at [facility] for evaluation, as per protocol. Routing to flow at [facility] at [facility].  Reason for Disposition . Patient sounds very sick or weak to the triager  Answer Assessment - Initial Assessment Questions 1. DESCRIPTION: "Describe how you are feeling."     She cant move 2. SEVERITY: "How bad is it?"  "Can you stand and walk?"   - MILD - Feels weak or tired, but does not interfere with work, school or normal activities   - MODERATE - Able to stand and walk; weakness interferes with work, school, or normal activities   - SEVERE - Unable to stand or walk     severe 3. ONSET:  "When did the weakness begin?"     This morning 4. CAUSE: "What do you think is causing the weakness?"     Does not 5. MEDICINES: "Have you recently started a new medicine or had a change in the amount of a medicine?"     no 6. OTHER SYMPTOMS: "Do you have any other symptoms?" (e.g., chest pain, fever, cough, SOB, vomiting, diarrhea, bleeding, other areas of pain)     Pain in right leg 7. PREGNANCY: "Is there any chance you are pregnant?" "When was your last menstrual period?"     n/a  Protocols used: WEAKNESS (GENERALIZED) AND FATIGUE-A-AH

## 2018-06-26 NOTE — ED Notes (Signed)
Patient transported to CT 

## 2018-06-26 NOTE — Progress Notes (Signed)
Request from [facility] for transfer to [facility].  She has a h/o HTN, HLD, TB, cognitive decline, and MS and presented with c/o weakness.  She had RUE weakness x 1 week, LLE weakness x 1 day.  She usually ambulates with a walker, unable to do so x a few days.  She does not have symptoms of UTI but "she does have a UTI".  Neurology at [facility] reports usually no worsening of MS but suggest MRI to r/o new enhancing lesion.    It is not clear that patient has a UTI since she has no symptoms - could be asymptomatic bacteriuria.  However, given her difficulty with ambulation in the setting of possible MS flare, it is reasonable to observe the patient on med surg while obtaining an MRI.  Recommend PT evaluation, as well.  Will place in observation on [facility] for now.  Georgana Curio, M.D.

## 2018-06-26 NOTE — ED Notes (Signed)
Son, Ethelene Browns, was notified.  Message left on his phone @ [phone].

## 2018-06-26 NOTE — ED Provider Notes (Signed)
[facility] EMERGENCY DEPARTMENT Provider Note   CSN: [id number] Arrival date & time: 06/26/18  1333     History   Chief Complaint Chief Complaint  Patient presents with  . Weakness  . Fall    HPI Allison Bailey is a 72 y.o. female.  Patient states she has been feeling "weak" for several days. She was unable to get out of bed today to go the bathroom. She ended up having to urinate in the bed. New left lower leg weakness. Fall at home yesterday that patient does not recall. Symmetric smile. Patient is able to maintain arms extended arms, but does have a little right arm drift. Difficulty lifting left leg from bed.   The history is provided by the patient and a relative. No language interpreter was used.  Weakness  Primary symptoms include movement disorder. This is a new problem. The current episode started 2 days ago. The problem has been gradually worsening. There was left lower extremity focality noted. There has been no fever. Associated symptoms include confusion. Pertinent negatives include no shortness of breath, no chest pain and no vomiting.  Fall  Pertinent negatives include no chest pain and no shortness of breath.    Past Medical History:  Diagnosis Date  . Hyperlipidemia   . Hypertension   . MS (multiple sclerosis) (HCC)   . Tuberculosis     Patient Active Problem List   Diagnosis Date Noted  . Estradiol deficiency 04/02/2018  . Screening for breast cancer 04/02/2018  . Heart murmur 10/02/2017  . Cognitive impairment 05/17/2016  . Swelling of extremity 03/18/2013  . Onychomycosis of toenail 03/18/2013  . Hammer toe 03/18/2013  . Chronic UTI 01/22/2009  . Hyperlipidemia 06/12/2007  . SCLEROSIS, MULTIPLE 06/12/2007  . Essential hypertension 06/12/2007  . URINARY INCONTINENCE 06/12/2007    Past Surgical History:  Procedure Laterality Date  . ABDOMINAL HYSTERECTOMY    . FLEXIBLE SIGMOIDOSCOPY    . left breast biopsy    . TUBAL LIGATION         OB History   None      Home Medications    Prior to Admission medications   Medication Sig Start Date End Date Taking? Authorizing Provider  tiZANidine (ZANAFLEX) 2 MG tablet Take 1 tablet (2 mg total) by mouth every 8 (eight) hours as needed for muscle spasms. 04/02/18  Yes Wendling, Jilda Roche, DO    Family History Family History  Problem Relation Age of Onset  . Cancer Mother        questionable   . Hypertension Unknown   . Heart disease Neg Hx     Social History Social History   Tobacco Use  . Smoking status: Former Smoker    Packs/day: 2.00    Years: 20.00    Pack years: 40.00    Last attempt to quit: 1979    Years since quitting: 40.8  . Smokeless tobacco: Never Used  Substance Use Topics  . Alcohol use: No  . Drug use: No     Allergies   Patient has no known allergies.   Review of Systems Review of Systems  Constitutional: Positive for appetite change.  Respiratory: Negative for shortness of breath.   Cardiovascular: Positive for leg swelling. Negative for chest pain.  Gastrointestinal: Negative for nausea and vomiting.  Genitourinary: Negative for difficulty urinating.  Musculoskeletal: Positive for gait problem.  Skin: Negative for rash.  Neurological: Positive for weakness. Negative for facial asymmetry.  Psychiatric/Behavioral: Positive for confusion.  Physical Exam Updated Vital Signs BP 135/84 (BP Location: Left Arm)   Pulse 97   Temp 98.8 F (37.1 C) (Oral)   Resp 18   Ht 5' 4.5" (1.638 m)   SpO2 98%   BMI 23.15 kg/m   Physical Exam  Constitutional: She appears well-developed and well-nourished.  HENT:  Head: Atraumatic.  Eyes: Pupils are equal, round, and reactive to light. EOM are normal.  Neck: Neck supple.  Cardiovascular:  Murmur heard. Pulmonary/Chest: Effort normal and breath sounds normal.  Abdominal: Soft. Bowel sounds are normal.  Musculoskeletal: She exhibits edema.  Neurological: She is alert. No  sensory deficit. GCS eye subscore is 4. GCS verbal subscore is 4. GCS motor subscore is 6.  Minimal right arm drift. Difficulty lifting left leg from bed.  Skin: No rash noted.  Psychiatric: She has a normal mood and affect.  Nursing note and vitals reviewed.    ED Treatments / Results  Labs (all labs ordered are listed, but only abnormal results are displayed) Labs Reviewed  URINALYSIS, ROUTINE W REFLEX MICROSCOPIC - Abnormal; Notable for the following components:      Result Value   Color, Urine AMBER (*)    APPearance HAZY (*)    Specific Gravity, Urine >1.030 (*)    Hgb urine dipstick MODERATE (*)    Ketones, ur >80 (*)    Nitrite POSITIVE (*)    Leukocytes, UA MODERATE (*)    All other components within normal limits  CBC WITH DIFFERENTIAL/PLATELET - Abnormal; Notable for the following components:   MCH 24.9 (*)    MCHC 29.4 (*)    All other components within normal limits  BASIC METABOLIC PANEL - Abnormal; Notable for the following components:   Potassium 3.3 (*)    All other components within normal limits  URINALYSIS, MICROSCOPIC (REFLEX) - Abnormal; Notable for the following components:   Bacteria, UA MANY (*)    All other components within normal limits    EKG EKG Interpretation  Date/Time:  Tuesday June 26 2018 14:31:11 EST Ventricular Rate:  89 PR Interval:    QRS Duration: 190 QT Interval:  378 QTC Calculation: 460 R Axis:   17 Text Interpretation:  Sinus rhythm Probable left atrial enlargement Left ventricular hypertrophy Baseline wander in lead(s) V3 No previous tracing Confirmed by Gwyneth Sprout ([id number]) on 06/26/2018 2:52:08 PM   Radiology Ct Head Wo Contrast  Result Date: 06/26/2018 CLINICAL DATA:  Right-sided weakness. EXAM: CT HEAD WITHOUT CONTRAST TECHNIQUE: Contiguous axial images were obtained from the base of the skull through the vertex without intravenous contrast. COMPARISON:  Brain MRI 05/01/2012 FINDINGS: Brain: There is no mass,  hemorrhage or extra-axial collection. There is generalized atrophy without lobar predilection. There is bilateral periventricular white matter hypoattenuation, greatest in the right frontal lobe. Vascular: No abnormal hyperdensity of the major intracranial arteries or dural venous sinuses. No intracranial atherosclerosis. Skull: The visualized skull base, calvarium and extracranial soft tissues are normal. Sinuses/Orbits: No fluid levels or advanced mucosal thickening of the visualized paranasal sinuses. No mastoid or middle ear effusion. The orbits are normal. IMPRESSION: 1. No acute intracranial abnormality. 2. Atrophy and advanced chronic white matter disease, which may be secondary to chronic small vessel ischemia or demyelination. Electronically Signed   By: Deatra Robinson M.D.   On: 06/26/2018 15:18    Procedures Procedures (including critical care time)  Medications Ordered in ED Medications - No data to display   Initial Impression / Assessment and Plan / ED Course  I have reviewed the triage vital signs and the nursing notes.  Pertinent labs & imaging results that were available during my care of the patient were reviewed by me and considered in my medical decision making (see chart for details).     Concern for MS exacerbation. May be due to current UTI. Given 1 gm IV rocephin. Plan admission. Consult with neuro-hospitalist--recommends MRI to evaluate for enhancing lesions--if positive, patient will need steroids and admitting team will need to re-request neuro consult.  Consult with hospitalist--admit to [facility], observation status.  Final Clinical Impressions(s) / ED Diagnoses   Final diagnoses:  Weakness  Acute cystitis with hematuria    ED Discharge Orders    None       Felicie Morn, NP 06/26/18 1704    Gwyneth Sprout, MD 06/26/18 2018

## 2018-06-26 NOTE — H&P (Signed)
History and Physical    Allison Bailey ZOX:[id number] DOB: 1946-04-07 DOA: 06/26/2018  PCP: Sharlene Dory, DO patient coming from: [facility]  I have personally briefly reviewed patient's old medical records in [facility]  Chief Complaint: Weakness  HPI: Allison Bailey is a 72 y.o. female with medical history significant of multiple sclerosis hypertension presents with weakness.  Per medical records patient has had worsening right-arm  Weakness, left leg weakness.  She is been using her wheelchair.  Found to have a urinary tract infection.  She was given IV Rocephin.  Neurology recommended MRI to evaluate for any new lesions.  Head CT nonacute   Review of Systems: Unobtainable as patient is resting   Past Medical History:  Diagnosis Date  . Hyperlipidemia   . Hypertension   . MS (multiple sclerosis) (HCC)   . Tuberculosis     Past Surgical History:  Procedure Laterality Date  . ABDOMINAL HYSTERECTOMY    . FLEXIBLE SIGMOIDOSCOPY    . left breast biopsy    . TUBAL LIGATION       reports that she quit smoking about 40 years ago. She has a 40.00 pack-year smoking history. She has never used smokeless tobacco. She reports that she does not drink alcohol or use drugs.  No Known Allergies  Family History  Problem Relation Age of Onset  . Cancer Mother        questionable   . Hypertension Unknown   . Heart disease Neg Hx      Prior to Admission medications   Medication Sig Start Date End Date Taking? Authorizing Provider  tiZANidine (ZANAFLEX) 2 MG tablet Take 1 tablet (2 mg total) by mouth every 8 (eight) hours as needed for muscle spasms. 04/02/18  Yes Sharlene Dory, DO    Physical Exam: Vitals:   06/26/18 1342 06/26/18 1344 06/26/18 1608 06/26/18 1849  BP: 135/84  (!) 160/93 (!) 164/89  Pulse: 97  84 94  Resp: 18  18 15   Temp: 98.8 F (37.1 C)     TempSrc: Oral     SpO2: 98%  98% 96%  Height:  5' 4.5" (1.638 m)       Constitutional: NAD, resting Vitals:   06/26/18 1342 06/26/18 1344 06/26/18 1608 06/26/18 1849  BP: 135/84  (!) 160/93 (!) 164/89  Pulse: 97  84 94  Resp: 18  18 15   Temp: 98.8 F (37.1 C)     TempSrc: Oral     SpO2: 98%  98% 96%  Height:  5' 4.5" (1.638 m)      Respiratory: clear to auscultation bilaterally, no wheezing, no crackles. Normal respiratory effort. No accessory muscle use.  Cardiovascular: Regular rate and rhythm, + murmurs   Abdomen: Soft nondistended n, no masses palpated.  Bowel sounds positive.  Neurologic: Resting unable to do neurological exam   Labs on Admission: I have personally reviewed following labs and imaging studies  CBC: Recent Labs  Lab 06/26/18 1443 06/26/18 2202  WBC 8.6 7.2  NEUTROABS 6.5  --   HGB 12.6 11.1*  HCT 42.8 36.4  MCV 84.4 82.0  PLT 276 241   Basic Metabolic Panel: Recent Labs  Lab 06/26/18 1443 06/26/18 2202  NA 141  --   K 3.3*  --   CL 108  --   CO2 25  --   GLUCOSE 85  --   BUN 15  --   CREATININE 0.64 0.65  CALCIUM 8.9  --  GFR: CrCl cannot be calculated (Unknown ideal weight.). Liver Function Tests: No results for input(s): AST, ALT, ALKPHOS, BILITOT, PROT, ALBUMIN in the last 168 hours. No results for input(s): LIPASE, AMYLASE in the last 168 hours. No results for input(s): AMMONIA in the last 168 hours. Coagulation Profile: No results for input(s): INR, PROTIME in the last 168 hours. Cardiac Enzymes: No results for input(s): CKTOTAL, CKMB, CKMBINDEX, TROPONINI in the last 168 hours. BNP (last 3 results) No results for input(s): PROBNP in the last 8760 hours. HbA1C: No results for input(s): HGBA1C in the last 72 hours. CBG: No results for input(s): GLUCAP in the last 168 hours. Lipid Profile: No results for input(s): CHOL, HDL, LDLCALC, TRIG, CHOLHDL, LDLDIRECT in the last 72 hours. Thyroid Function Tests: No results for input(s): TSH, T4TOTAL, FREET4, T3FREE, THYROIDAB in the last 72  hours. Anemia Panel: No results for input(s): VITAMINB12, FOLATE, FERRITIN, TIBC, IRON, RETICCTPCT in the last 72 hours. Urine analysis:    Component Value Date/Time   COLORURINE AMBER (A) 06/26/2018 1421   APPEARANCEUR HAZY (A) 06/26/2018 1421   LABSPEC >1.030 (H) 06/26/2018 1421   PHURINE 6.0 06/26/2018 1421   GLUCOSEU NEGATIVE 06/26/2018 1421   HGBUR MODERATE (A) 06/26/2018 1421   HGBUR negative 01/22/2009 1136   BILIRUBINUR NEGATIVE 06/26/2018 1421   BILIRUBINUR n 04/04/2012 1252   KETONESUR >80 (A) 06/26/2018 1421   PROTEINUR NEGATIVE 06/26/2018 1421   UROBILINOGEN 0.2 04/04/2012 1252   UROBILINOGEN 0.2 01/22/2009 1136   NITRITE POSITIVE (A) 06/26/2018 1421   LEUKOCYTESUR MODERATE (A) 06/26/2018 1421    Radiological Exams on Admission: Ct Head Wo Contrast  Result Date: 06/26/2018 CLINICAL DATA:  Right-sided weakness. EXAM: CT HEAD WITHOUT CONTRAST TECHNIQUE: Contiguous axial images were obtained from the base of the skull through the vertex without intravenous contrast. COMPARISON:  Brain MRI 05/01/2012 FINDINGS: Brain: There is no mass, hemorrhage or extra-axial collection. There is generalized atrophy without lobar predilection. There is bilateral periventricular white matter hypoattenuation, greatest in the right frontal lobe. Vascular: No abnormal hyperdensity of the major intracranial arteries or dural venous sinuses. No intracranial atherosclerosis. Skull: The visualized skull base, calvarium and extracranial soft tissues are normal. Sinuses/Orbits: No fluid levels or advanced mucosal thickening of the visualized paranasal sinuses. No mastoid or middle ear effusion. The orbits are normal. IMPRESSION: 1. No acute intracranial abnormality. 2. Atrophy and advanced chronic white matter disease, which may be secondary to chronic small vessel ischemia or demyelination. Electronically Signed   By: Deatra Robinson M.D.   On: 06/26/2018 15:18      Assessment/Plan Principal Problem:    Weakness Active Problems:   SCLEROSIS, MULTIPLE   Essential hypertension   Cognitive impairment   Acute lower UTI   Hypokalemia   -IV Rocephin, follow urine culture. - MRI of the brain with/ without contrast ordered, follow neuro exam, consider neurology consultation -Replace potassium p.o. BMP in the a.m. Awaiting home med reconciliation  DVT prophylaxis: Lovenox Code Status: PResumed full no family present  Disposition Plan: Home 1 to 2 days  Admission status: Observation medical  Dashawn Golda Johnson-Pitts MD [facility] Pager [phone]  If 7PM-7AM, please contact night-coverage [email or web address] Password [facility]  06/26/2018, 11:52 PM

## 2018-06-26 NOTE — ED Notes (Signed)
Patient's wheelchair is left here at [facility] ED.  [facility] will not take the wheelchair with them.. Message left with Sherrill Raring.

## 2018-06-27 ENCOUNTER — Inpatient Hospital Stay (HOSPITAL_COMMUNITY): Payer: Medicare Other

## 2018-06-27 ENCOUNTER — Observation Stay (HOSPITAL_COMMUNITY): Payer: Medicare Other

## 2018-06-27 ENCOUNTER — Encounter (HOSPITAL_COMMUNITY): Payer: Self-pay

## 2018-06-27 DIAGNOSIS — E876 Hypokalemia: Secondary | ICD-10-CM | POA: Diagnosis not present

## 2018-06-27 DIAGNOSIS — G35 Multiple sclerosis: Secondary | ICD-10-CM

## 2018-06-27 DIAGNOSIS — Z79899 Other long term (current) drug therapy: Secondary | ICD-10-CM | POA: Diagnosis not present

## 2018-06-27 DIAGNOSIS — I351 Nonrheumatic aortic (valve) insufficiency: Secondary | ICD-10-CM

## 2018-06-27 DIAGNOSIS — I6381 Other cerebral infarction due to occlusion or stenosis of small artery: Secondary | ICD-10-CM | POA: Diagnosis present

## 2018-06-27 DIAGNOSIS — R29706 NIHSS score 6: Secondary | ICD-10-CM | POA: Diagnosis present

## 2018-06-27 DIAGNOSIS — R531 Weakness: Secondary | ICD-10-CM | POA: Diagnosis not present

## 2018-06-27 DIAGNOSIS — I639 Cerebral infarction, unspecified: Secondary | ICD-10-CM | POA: Diagnosis not present

## 2018-06-27 DIAGNOSIS — N390 Urinary tract infection, site not specified: Secondary | ICD-10-CM | POA: Diagnosis not present

## 2018-06-27 DIAGNOSIS — Z8611 Personal history of tuberculosis: Secondary | ICD-10-CM | POA: Diagnosis not present

## 2018-06-27 DIAGNOSIS — E785 Hyperlipidemia, unspecified: Secondary | ICD-10-CM | POA: Diagnosis present

## 2018-06-27 DIAGNOSIS — Z87891 Personal history of nicotine dependence: Secondary | ICD-10-CM | POA: Diagnosis not present

## 2018-06-27 DIAGNOSIS — R4189 Other symptoms and signs involving cognitive functions and awareness: Secondary | ICD-10-CM | POA: Diagnosis present

## 2018-06-27 DIAGNOSIS — I10 Essential (primary) hypertension: Secondary | ICD-10-CM | POA: Diagnosis not present

## 2018-06-27 DIAGNOSIS — Z8673 Personal history of transient ischemic attack (TIA), and cerebral infarction without residual deficits: Secondary | ICD-10-CM | POA: Diagnosis not present

## 2018-06-27 LAB — PHOSPHORUS: Phosphorus: 3.3 mg/dL (ref 2.5–4.6)

## 2018-06-27 LAB — LIPID PANEL
Cholesterol: 152 mg/dL (ref 0–200)
HDL: 73 mg/dL (ref >40)
LDL Cholesterol: 63 mg/dL (ref 0–99)
Total CHOL/HDL Ratio: 2.1 RATIO
Triglycerides: 81 mg/dL (ref <150)
VLDL: 16 mg/dL (ref 0–40)

## 2018-06-27 LAB — BASIC METABOLIC PANEL
Anion gap: 7 (ref 5–15)
BUN: 14 mg/dL (ref 8–23)
CHLORIDE: 111 mmol/L (ref 98–111)
CO2: 23 mmol/L (ref 22–32)
CREATININE: 0.68 mg/dL (ref 0.44–1.00)
Calcium: 8.4 mg/dL — ABNORMAL LOW (ref 8.9–10.3)
GFR calc Af Amer: >60 mL/min (ref >60)
GFR calc non Af Amer: >60 mL/min (ref >60)
Glucose, Bld: 84 mg/dL (ref 70–99)
Potassium: 3.6 mmol/L (ref 3.5–5.1)
SODIUM: 141 mmol/L (ref 135–145)

## 2018-06-27 LAB — HEMOGLOBIN A1C
HEMOGLOBIN A1C: 5.3 % (ref 4.8–5.6)
MEAN PLASMA GLUCOSE: 105.41 mg/dL

## 2018-06-27 LAB — ECHOCARDIOGRAM COMPLETE: HEIGHTINCHES: 64.5 in

## 2018-06-27 LAB — MAGNESIUM: Magnesium: 2 mg/dL (ref 1.7–2.4)

## 2018-06-27 MED ORDER — STROKE: EARLY STAGES OF RECOVERY BOOK
Freq: Once | Status: AC
  Administered 2018-06-27: 12:00:00

## 2018-06-27 MED ORDER — GADOBUTROL 1 MMOL/ML IV SOLN
7.5000 mL | Freq: Once | INTRAVENOUS | Status: AC | PRN
  Administered 2018-06-27: 6 mL via INTRAVENOUS

## 2018-06-27 MED ORDER — SENNOSIDES-DOCUSATE SODIUM 8.6-50 MG PO TABS: 1.0000 | ORAL_TABLET | Freq: Every evening | ORAL | Status: DC | PRN

## 2018-06-27 MED ORDER — ATORVASTATIN CALCIUM 80 MG PO TABS
80.0000 mg | ORAL_TABLET | Freq: Every day | ORAL | Status: DC
  Administered 2018-06-27 – 2018-06-28 (×2): 80 mg via ORAL
  Filled 2018-06-27 (×2): qty 1

## 2018-06-27 MED ORDER — ASPIRIN 325 MG PO TABS
325.0000 mg | ORAL_TABLET | Freq: Every day | ORAL | Status: DC
  Administered 2018-06-27 – 2018-06-29 (×3): 325 mg via ORAL
  Filled 2018-06-27 (×3): qty 1

## 2018-06-27 MED ORDER — ENOXAPARIN SODIUM 40 MG/0.4ML ~~LOC~~ SOLN: 40.0000 mg | SUBCUTANEOUS | Status: DC

## 2018-06-27 MED ORDER — SODIUM CHLORIDE 0.9 % IV SOLN
INTRAVENOUS | Status: DC
  Administered 2018-06-27 – 2018-06-28 (×3): via INTRAVENOUS

## 2018-06-27 NOTE — Evaluation (Signed)
Speech Language Pathology Evaluation Patient Details Name: Allison Bailey MRN: [id number] DOB: 1945-12-06 Today's Date: 06/27/2018 Time: 1000-1020 SLP Time Calculation (min) (ACUTE ONLY): 20 min  Problem List:  Patient Active Problem List   Diagnosis Date Noted  . Stroke (HCC) 06/27/2018  . Weakness 06/26/2018  . Acute lower UTI 06/26/2018  . Hypokalemia 06/26/2018  . Estradiol deficiency 04/02/2018  . Screening for breast cancer 04/02/2018  . Heart murmur 10/02/2017  . Cognitive impairment 05/17/2016  . Swelling of extremity 03/18/2013  . Onychomycosis of toenail 03/18/2013  . Hammer toe 03/18/2013  . Chronic UTI 01/22/2009  . Hyperlipidemia 06/12/2007  . SCLEROSIS, MULTIPLE 06/12/2007  . Essential hypertension 06/12/2007  . URINARY INCONTINENCE 06/12/2007   Past Medical History:  Past Medical History:  Diagnosis Date  . Hyperlipidemia   . Hypertension   . MS (multiple sclerosis) (HCC)   . Tuberculosis    Past Surgical History:  Past Surgical History:  Procedure Laterality Date  . ABDOMINAL HYSTERECTOMY    . FLEXIBLE SIGMOIDOSCOPY    . left breast biopsy    . TUBAL LIGATION     HPI:  72 y.o. female with medical history significant for multiple sclerosis, hypertension presents with worsesning RUE/LLE weakness.   MRI: worsening severe chronic demyelination superimposed on chronicsmall vessel ischemic changes; new nonacute LEFT thalamus lacunar infarct.   Assessment / Plan / Recommendation Clinical Impression  Pt presents with cognitive deficits, however, baseline function unknown.  She demonstrates impaired recall of verbal information; disorientation to elements of time; impaired generative naming (phonemic and semantic) and higher level attention.  Language and speech are WNL. Pt has tendency to respond to inquiries with "I don't know." However, when offered time, she is is often able to retrieve requested information.  Will follow briefly for deeper assessment and  to determine baseline function per spouse.     SLP Assessment  SLP Recommendation/Assessment: Patient needs continued Speech Lanaguage Pathology Services SLP Visit Diagnosis: Cognitive communication deficit (R41.841)    Follow Up Recommendations       Frequency and Duration min 2x/week  1 week      SLP Evaluation Cognition  Overall Cognitive Status: No family/caregiver present to determine baseline cognitive functioning Arousal/Alertness: Awake/alert Orientation Level: Oriented to person;Oriented to place;Disoriented to time;Disoriented to situation Attention: Selective Selective Attention: Impaired Selective Attention Impairment: Verbal basic Memory: Impaired Memory Impairment: Retrieval deficit Awareness: Impaired Executive Function: Self Monitoring Self Monitoring: Impaired Safety/Judgment: Impaired       Comprehension  Auditory Comprehension Overall Auditory Comprehension: Appears within functional limits for tasks assessed    Expression Expression Primary Mode of Expression: Verbal Verbal Expression Overall Verbal Expression: Appears within functional limits for tasks assessed Written Expression Dominant Hand: Right   Oral / Motor  Oral Motor/Sensory Function Overall Oral Motor/Sensory Function: Within functional limits Motor Speech Overall Motor Speech: Appears within functional limits for tasks assessed   GO                    Blenda Mounts Laurice 06/27/2018, 10:37 AM   Marchelle Folks L. Samson Frederic, MA CCC/SLP Acute Rehabilitation Services Office number [phone] Pager [phone]

## 2018-06-27 NOTE — Clinical Social Work Note (Signed)
Clinical Social Work Assessment  Patient Details  Name: Allison Bailey MRN: [id number] Date of Birth: 1946/03/19  Date of referral:  06/27/18               Reason for consult:  Facility Placement                Permission sought to share information with:  Facility Sport and exercise psychologist, Family Supports Permission granted to share information::  Yes, Verbal Permission Granted  Name::     Estanislado Emms  Agency::  SNF  Relationship::  Husband, Son  Sport and exercise psychologist Information:     Housing/Transportation Living arrangements for the past 2 months:  Single Family Home Source of Information:  Patient, Adult Children, Spouse Patient Interpreter Needed:  None Criminal Activity/Legal Involvement Pertinent to Current Situation/Hospitalization:  No - Comment as needed Significant Relationships:  Adult Children, Spouse Lives with:  Self, Spouse Do you feel safe going back to the place where you live?  Yes Need for family participation in patient care:  Yes (Comment)  Care giving concerns:  Patient from home with spouse, would benefit from short term rehab at discharge. Patient's family is concerned about whether the patient will participate, because she's gone to rehab before and didn't participate, got kicked out. Patient says she will think about rehab.   Social Worker assessment / plan:  CSW met with patient and son at bedside, with patient's husband on speaker phone. CSW explained recommendation for SNF placement. Son and husband indicated that they were familiar, as the husband has been at [facility] and they have attempted rehab with the patient in the past. Patient engaged in discussion and said she really didn't want to do it, but after encouragement from the son and husband, she said she'd think about it. CSW to fax out referral and follow up with the patient tomorrow morning on her decision regarding SNF.  Employment status:  Retired Nurse, adult PT  Recommendations:  [place] / Referral to community resources:  [place]  Patient/Family's Response to care:  Patient unsure if she would like SNF or not; she is thinking about it, and will make a decision tomorrow morning.  Patient/Family's Understanding of and Emotional Response to Diagnosis, Current Treatment, and Prognosis:  Patient and family discussed how the patient doesn't want to participate with therapies. Patient's husband told the patient that it would be good for her, and it would be beneficial for him trying to get her stronger. Patient said she would think about it, and agreed for CSW to start paperwork in the mean time.   Emotional Assessment Appearance:  Appears stated age Attitude/Demeanor/Rapport:  Engaged Affect (typically observed):  Pleasant Orientation:  Oriented to Self, Oriented to Place Alcohol / Substance use:  Not Applicable Psych involvement (Current and /or in the community):  No (Comment)  Discharge Needs  Concerns to be addressed:  Care Coordination Readmission within the last 30 days:  No Current discharge risk:  Dependent with Mobility, Physical Impairment Barriers to Discharge:  Continued Medical Work up, [place], [place] 06/27/2018, 4:24 PM

## 2018-06-27 NOTE — Consult Note (Addendum)
Neurology Consultation  Reason for Consult: Stroke Referring Physician: Welding  CC: Right arm weakness  History is obtained from: Chart  HPI: Allison Bailey is a 72 y.o. female with history of tuberculosis, MS, hypertension, hyperlipidemia.  Per note she has had right upper extremity weakness for approximately 1 week, left lower extremity weakness for approximately 1 day.  Apparently she usually ambulates with a walker, however over the last couple days she is unable to do so.  On admission she was found to have UTI .  MRI was obtained which did show a chronic left thalamic infarct, new from prior MRI Brain.  Neurology was consulted for the new left thalamic infarct.   ED course:- CT head, urinalysis, CBC, BMP   Chart review: No previous neurological notes.   LKW: Unknown tpa given?: no, unknown LK W Premorbid modified Rankin scale (mRS): 5 NIH stroke scale 6 ICH Score: No blood on CT   ROS:  Unable to obtain due to altered mental status.   Past Medical History:  Diagnosis Date  . Hyperlipidemia   . Hypertension   . MS (multiple sclerosis) (HCC)   . Tuberculosis    Family History  Problem Relation Age of Onset  . Cancer Mother        questionable   . Hypertension Unknown   . Heart disease Neg Hx    Social History:   reports that she quit smoking about 40 years ago. She has a 40.00 pack-year smoking history. She has never used smokeless tobacco. She reports that she does not drink alcohol or use drugs.  Medications  Current Facility-Administered Medications:  .   stroke: mapping our early stages of recovery book, , Does not apply, Once, Black, Karen M, NP .  0.9 %  sodium chloride infusion, , Intravenous, Continuous, Black, Lesle Chris, NP, Last Rate: 50 mL/hr at 06/27/18 0902 .  acetaminophen (TYLENOL) tablet 650 mg, 650 mg, Oral, Q6H PRN **OR** acetaminophen (TYLENOL) suppository 650 mg, 650 mg, Rectal, Q6H PRN, Johnson-Pitts, Endia, MD .  cefTRIAXone (ROCEPHIN) 1 g  in sodium chloride 0.9 % 100 mL IVPB, 1 g, Intravenous, Q24H, Johnson-Pitts, Endia, MD, Last Rate: 200 mL/hr at 06/26/18 2320, 1 g at 06/26/18 2320 .  enoxaparin (LOVENOX) injection 40 mg, 40 mg, Subcutaneous, Q24H, Johnson-Pitts, Endia, MD, 40 mg at 06/26/18 2236 .  ondansetron (ZOFRAN) tablet 4 mg, 4 mg, Oral, Q6H PRN **OR** ondansetron (ZOFRAN) injection 4 mg, 4 mg, Intravenous, Q6H PRN, Johnson-Pitts, Endia, MD .  polyethylene glycol (MIRALAX / GLYCOLAX) packet 17 g, 17 g, Oral, Daily PRN, Johnson-Pitts, Endia, MD .  senna-docusate (Senokot-S) tablet 1 tablet, 1 tablet, Oral, QHS PRN, Black, Lesle Chris, NP   Exam: Current vital signs: BP (!) 155/87 (BP Location: Left Arm)   Pulse 90   Temp 98.5 F (36.9 C) (Oral)   Resp 18   Ht 5' 4.5" (1.638 m)   SpO2 96%   BMI 23.15 kg/m  Vital signs in last 24 hours: Temp:  [98.4 F (36.9 C)-98.8 F (37.1 C)] 98.5 F (36.9 C) (11/13 0736) Pulse Rate:  [84-98] 90 (11/13 0736) Resp:  [15-19] 18 (11/13 0736) BP: (132-164)/(79-93) 155/87 (11/13 0736) SpO2:  [96 %-98 %] 96 % (11/13 0736)  Physical Exam  Constitutional: Appears well-developed and well-nourished.  Psych: Affect appropriate to situation Eyes: No scleral injection HENT: No OP obstrucion Head: Normocephalic.  Cardiovascular: Normal rate and regular rhythm.  Respiratory: Effort normal, non-labored breathing GI: Soft.  No distension. There is  no tenderness.  Skin: WDI  Neuro: Mental Status: Patient is awake, alert, oriented person and hospital but not month, year Patient is not able to give a clear and coherent history.  Cranial Nerves: II: Visual Fields are full. Pupils are equal, round, and reactive to light.   III,IV, VI: EOMI are intact however left eye does not deviate medially completely in the right eye does have nystagmus at far gaze V: Facial sensation is symmetric to temperature VII: Facial movement is symmetric.  VIII: hearing is intact to voice X: Uvula elevates  symmetrically XI: Shoulder shrug is symmetric. XII: tongue is midline without atrophy or fasciculations.  Motor: Distally she is 5/5 hand, 4/5 in the biceps, 5/5 tricep, 3/5 with shoulder abduction on the right.  Patient has difficulty raising her right leg but is able to hold it for 5 seconds antigravity-of note she has severe pain in her hip and also her knee.  Patient's left leg is spastic.  Left arm has 5/5 strength Sensory: Sensation is symmetric to light touch and temperature in the arms and legs. Deep Tendon Reflexes: 2+ and symmetric in upper extremities,   Plantars: Toes are downgoing bilaterally.  Cerebellar: Normal finger-nose on the left unable to do on the right and unable to do heel-to-shin  Labs I have reviewed labs in epic and the results pertinent to this consultation are:   CBC    Component Value Date/Time   WBC 7.2 06/26/2018 2202   RBC 4.44 06/26/2018 2202   HGB 11.1 (L) 06/26/2018 2202   HCT 36.4 06/26/2018 2202   PLT 241 06/26/2018 2202   MCV 82.0 06/26/2018 2202   MCH 25.0 (L) 06/26/2018 2202   MCHC 30.5 06/26/2018 2202   RDW 14.7 06/26/2018 2202   LYMPHSABS 1.3 06/26/2018 1443   MONOABS 0.7 06/26/2018 1443   EOSABS 0.0 06/26/2018 1443   BASOSABS 0.0 06/26/2018 1443    CMP     Component Value Date/Time   NA 141 06/27/2018 0438   K 3.6 06/27/2018 0438   CL 111 06/27/2018 0438   CO2 23 06/27/2018 0438   GLUCOSE 84 06/27/2018 0438   BUN 14 06/27/2018 0438   CREATININE 0.68 06/27/2018 0438   CALCIUM 8.4 (L) 06/27/2018 0438   PROT 7.1 05/17/2016 1348   ALBUMIN 3.7 05/17/2016 1348   AST 12 05/17/2016 1348   ALT 7 05/17/2016 1348   ALKPHOS 67 05/17/2016 1348   BILITOT 1.1 05/17/2016 1348   GFRNONAA >60 06/27/2018 0438   GFRAA >60 06/27/2018 0438    Lipid Panel     Component Value Date/Time   CHOL 152 06/27/2018 0438   TRIG 81 06/27/2018 0438   TRIG 50 07/11/2006 1110   HDL 73 06/27/2018 0438   CHOLHDL 2.1 06/27/2018 0438   VLDL 16  06/27/2018 0438   LDLCALC 63 06/27/2018 0438   LDLDIRECT 95.4 04/04/2012 1200     Imaging I have reviewed the images obtained:  CT-scan of the brain-no acute intracranial abnormalities, atrophy and advanced chronic white matter disease which may be secondary to chronic small vessel ischemia or demyelination  MRI examination of the brain--worsening of confluent many lesions demonstrating low T1 signal seen with black holes of demyelination and, demonstrates T2 shine through.  No reduced diffusion to suggest acute ischemia or hyperacute demyelination.  New left pontine lacunar infarct.  Allison Morn PA-C [facility] [phone]  M-F  (9:00 am- 5:00 PM)  06/27/2018, 10:02 AM   Assessment and plan per attending  NEUROHOSPITALIST ADDENDUM Performed a face to face diagnostic evaluation.   I have reviewed the contents of history and physical exam as documented by PA/ARNP/Resident and agree with above documentation.  I have discussed and formulated the above plan as documented. Edits to the note have been made as needed.   ASSESSMENT AND PLAN   72 year old female with hypertension, hyperlipidemia, multiple sclerosis not on disease modifying therapy, presents with weakness, leaning to the right side, fall.  Noted to have urinary tract infection on arrival.  An MRI brain was performed to assess for MS exacerbation-did not show any enhancing lesions, however showed progression of her MS compared to prior MRI brain.  Also noted an MRI brain-chronic left lacunar infarct, which was new compared to her previous MRI.  Reviewed notes from PCP who planned on referring patient to neurologist to start Betaseron however it appears patient never followed up.  According to her husband it has been at least a decade since she has received her MS medications.  Impression  Exacerbation of prior stroke/MS deficits in the setting of urinary tract infection   Plan No need for IV steroids as  patient does not have any enhancing lesions Continue to treat underlying infection Start aspirin 81 mg and Lipitor for secondary stroke prevention/hyperlipidemia Manage blood pressure, goal BP of less than 140/90 mmHg PT/OT evaluation  Discussed at length with son importance of follow-up with neurologist for MS. Outpatient referral to Dr. Epimenio Foot, MS specialist.    Georgiana Spinner Jackqulyn Mendel MD [facility] [phone]   If 7pm to 7am, please call on call as listed on AMION.

## 2018-06-27 NOTE — Evaluation (Signed)
Physical Therapy Evaluation Patient Details Name: Allison Bailey MRN: [id number] DOB: 07/06/1946 Today's Date: 06/27/2018   History of Present Illness  Pt is a 72 y.o female with PMH consisting of MS, HTN, hyperlipidemia, and tuberculosis presents to ED with RUE and LLE weakness. MRI on 06/27/2018 shows chronic demyelination, nonacute L thalamus lacunar infarct, and parenchymal brain volume loss.  Clinical Impression  Pt presents supine, HOB elevated, and alert. Pt states willingness to participate in PT. Prior to admission, pt used wheelchair for mobility, even inside the house. Pt lives with husband and other family, who are able to provide 24 hour support. Pt is overall max assist for bed mobility and transfers. Pt initially unable to orient self to midline, but improvements made following multimodal cueing. Pt will require additional support from SNF due to decreased transfer ability, strength, and level of assistance needed. Pt would benefit from skilled PT in order to increase strength and functional mobility.      Follow Up Recommendations SNF    Equipment Recommendations  None recommended by PT    Recommendations for Other Services       Precautions / Restrictions Precautions Precautions: Fall Restrictions Weight Bearing Restrictions: No      Mobility  Bed Mobility Overal bed mobility: Needs Assistance Bed Mobility: Rolling;Supine to Sit;Sit to Supine Rolling: Mod assist   Supine to sit: Max assist Sit to supine: Max assist   General bed mobility comments: Pt required mod A for rolling, with verbal cueing given for reaching RUE towards bed rail.  Pt required max A supine to sit in order to support BLE movement and trunk elevation. Verbal cueing given throughout. Pt 2+ max A for sit to supine in order to facilitate BLE and trunk elevation.   Transfers Overall transfer level: Needs assistance Equipment used: Rolling walker (2 wheeled);2 person hand held  assist Transfers: Sit to/from Stand Sit to Stand: Max assist;+2 physical assistance         General transfer comment: Pt 2+ max A for sit to stand for initiating power, maintaining force throughout the movement and sustained stability once standing with use of RW. Second sit to stand used 2 person HHA max A. Pt did not make complete sit to stand, and was sat back EOB. Facilitation of BLE completed prior to sit to stand to improve BOS. Verbal cueing given for use of UE for push off.   Ambulation/Gait Ambulation/Gait assistance: (Deferred  )              Stairs            Wheelchair Mobility    Modified Rankin (Stroke Patients Only) Modified Rankin (Stroke Patients Only) Pre-Morbid Rankin Score: Moderately severe disability Modified Rankin: Severe disability     Balance Overall balance assessment: Needs assistance Sitting-balance support: Bilateral upper extremity supported;Feet supported Sitting balance-Leahy Scale: Poor Sitting balance - Comments: Pt requires mod A to remain sitting, progressing to min guard with verbal cueing. Pt initially unable to orient self to midline. Multimodal cueing and tactile facilitation given to maintain upright position. Postural control: Right lateral lean Standing balance support: Bilateral upper extremity supported;During functional activity Standing balance-Leahy Scale: Zero Standing balance comment: Pt unable to maintain standing balance without BUE assistance for PT and max A                             Pertinent Vitals/Pain Pain Assessment: Faces Faces Pain Scale: Hurts even  more Pain Location: RLE Pain Descriptors / Indicators: Stabbing;Aching Pain Intervention(s): Limited activity within patient's tolerance;Monitored during session    Home Living Family/patient expects to be discharged to:: Private residence Living Arrangements: Spouse/significant other;Other relatives Available Help at Discharge:  Family;Available 24 hours/day Type of Home: House Home Access: Stairs to enter Entrance Stairs-Rails: None Entrance Stairs-Number of Steps: 4(unsure of number ) Home Layout: One level Home Equipment: Wheelchair - manual      Prior Function Level of Independence: Needs assistance               Hand Dominance   Dominant Hand: Right    Extremity/Trunk Assessment   Upper Extremity Assessment Upper Extremity Assessment: Defer to OT evaluation    Lower Extremity Assessment Lower Extremity Assessment: Generalized weakness;LLE deficits/detail LLE Coordination: decreased gross motor       Communication   Communication: No difficulties  Cognition Arousal/Alertness: Awake/alert Behavior During Therapy: Anxious;WFL for tasks assessed/performed Overall Cognitive Status: Impaired/Different from baseline Area of Impairment: Following commands;Safety/judgement;Problem solving;Awareness                       Following Commands: Follows one step commands consistently;Follows one step commands with increased time;Follows multi-step commands inconsistently Safety/Judgement: Decreased awareness of deficits;Decreased awareness of safety Awareness: Emergent Problem Solving: Slow processing;Requires verbal cues;Requires tactile cues;Difficulty sequencing;Decreased initiation        General Comments      Exercises     Assessment/Plan    PT Assessment Patient needs continued PT services  PT Problem List Decreased range of motion;Decreased strength;Decreased activity tolerance;Decreased balance;Decreased mobility;Decreased coordination;Decreased cognition;Decreased safety awareness;Decreased knowledge of use of DME;Impaired tone       PT Treatment Interventions DME instruction;Gait training;Stair training;Functional mobility training;Therapeutic activities;Therapeutic exercise;Balance training;Neuromuscular re-education;Wheelchair mobility training    PT Goals (Current  goals can be found in the Care Plan section)  Acute Rehab PT Goals Patient Stated Goal: to return to normal  PT Goal Formulation: With patient Time For Goal Achievement: 07/11/18 Potential to Achieve Goals: Fair    Frequency Min 4X/week   Barriers to discharge        Co-evaluation               AM-PAC PT "6 Clicks" Daily Activity  Outcome Measure Difficulty turning over in bed (including adjusting bedclothes, sheets and blankets)?: Unable Difficulty moving from lying on back to sitting on the side of the bed? : Unable Difficulty sitting down on and standing up from a chair with arms (e.g., wheelchair, bedside commode, etc,.)?: Unable Help needed moving to and from a bed to chair (including a wheelchair)?: Total Help needed walking in hospital room?: Total Help needed climbing 3-5 steps with a railing? : Total 6 Click Score: 6    End of Session Equipment Utilized During Treatment: Gait belt Activity Tolerance: Patient limited by pain Patient left: in bed;with call bell/phone within reach;with bed alarm set Nurse Communication: Mobility status PT Visit Diagnosis: Unsteadiness on feet (R26.81);Muscle weakness (generalized) (M62.81);Other symptoms and signs involving the nervous system (R29.898)    Time: 5784-6962 PT Time Calculation (min) (ACUTE ONLY): 34 min   Charges:              Rolm Bookbinder, SPT Acute Rehab [phone] (pager) [phone] (office)  Vladimir Lenhoff 06/27/2018, 10:22 AM

## 2018-06-27 NOTE — NC FL2 (Signed)
[place] MEDICAID FL2 LEVEL OF CARE SCREENING TOOL     IDENTIFICATION  Patient Name: Allison Bailey Birthdate: 23-Jan-1946 Sex: female Admission Date (Current Location): 06/26/2018  [facility] and [place] Number:  Producer, television/film/video and Address:  [facility], [address] [place], [place] [address]      Provider Number: [id number]  Attending Physician Name and Address:  Dimple Nanas, MD  Relative Name and Phone Number:       Current Level of Care: Hospital Recommended Level of Care: Skilled Nursing Facility Prior Approval Number:    Date Approved/Denied:   PASRR Number: [id number]  Discharge Plan: SNF    Current Diagnoses: Patient Active Problem List   Diagnosis Date Noted  . Stroke (HCC) 06/27/2018  . Weakness 06/26/2018  . Acute lower UTI 06/26/2018  . Hypokalemia 06/26/2018  . Estradiol deficiency 04/02/2018  . Screening for breast cancer 04/02/2018  . Heart murmur 10/02/2017  . Cognitive impairment 05/17/2016  . Swelling of extremity 03/18/2013  . Onychomycosis of toenail 03/18/2013  . Hammer toe 03/18/2013  . Chronic UTI 01/22/2009  . Hyperlipidemia 06/12/2007  . SCLEROSIS, MULTIPLE 06/12/2007  . Essential hypertension 06/12/2007  . URINARY INCONTINENCE 06/12/2007    Orientation RESPIRATION BLADDER Height & Weight     Self, Place  Normal Incontinent Weight:   Height:  5' 4.5" (163.8 cm)  BEHAVIORAL SYMPTOMS/MOOD NEUROLOGICAL BOWEL NUTRITION STATUS      Continent Diet(see DC summary)  AMBULATORY STATUS COMMUNICATION OF NEEDS Skin   Extensive Assist Verbally Normal                       Personal Care Assistance Level of Assistance  Bathing, Feeding, Dressing Bathing Assistance: Maximum assistance Feeding assistance: Maximum assistance Dressing Assistance: Maximum assistance     Functional Limitations Info  Sight, Hearing, Speech Sight Info: Adequate Hearing Info: Adequate Speech Info: Adequate     SPECIAL CARE FACTORS FREQUENCY  PT (By licensed PT), OT (By licensed OT)     PT Frequency: 5x/wk OT Frequency: 5x/wk            Contractures Contractures Info: Not present    Additional Factors Info  Code Status, Allergies Code Status Info: Full Allergies Info: NKA           Current Medications (06/27/2018):  This is the current hospital active medication list Current Facility-Administered Medications  Medication Dose Route Frequency Provider Last Rate Last Dose  . 0.9 %  sodium chloride infusion   Intravenous Continuous Gwenyth Bender, NP 50 mL/hr at 06/27/18 0902    . acetaminophen (TYLENOL) tablet 650 mg  650 mg Oral Q6H PRN Johnson-Pitts, Endia, MD       Or  . acetaminophen (TYLENOL) suppository 650 mg  650 mg Rectal Q6H PRN Johnson-Pitts, Endia, MD      . aspirin tablet 325 mg  325 mg Oral Daily Gwenyth Bender, NP   325 mg at 06/27/18 1222  . atorvastatin (LIPITOR) tablet 80 mg  80 mg Oral q1800 Gwenyth Bender, NP      . cefTRIAXone (ROCEPHIN) 1 g in sodium chloride 0.9 % 100 mL IVPB  1 g Intravenous Q24H Johnson-Pitts, Endia, MD 200 mL/hr at 06/26/18 2320 1 g at 06/26/18 2320  . enoxaparin (LOVENOX) injection 40 mg  40 mg Subcutaneous Q24H Johnson-Pitts, Endia, MD   40 mg at 06/26/18 2236  . ondansetron (ZOFRAN) tablet 4 mg  4 mg Oral Q6H  PRN Johnson-Pitts, Endia, MD       Or  . ondansetron (ZOFRAN) injection 4 mg  4 mg Intravenous Q6H PRN Johnson-Pitts, Endia, MD      . polyethylene glycol (MIRALAX / GLYCOLAX) packet 17 g  17 g Oral Daily PRN Johnson-Pitts, Endia, MD      . senna-docusate (Senokot-S) tablet 1 tablet  1 tablet Oral QHS PRN Black, Lesle Chris, NP         Discharge Medications: Please see discharge summary for a list of discharge medications.  Relevant Imaging Results:  Relevant Lab Results:   Additional Information SS#: [id number]  Baldemar Lenis, LCSW

## 2018-06-27 NOTE — Progress Notes (Signed)
PT Progress Note for Charges     06/27/18 1300  PT General Charges  $$ ACUTE PT VISIT 1 Visit  PT Evaluation  $PT Eval Moderate Complexity 1 Mod  PT Treatments  $Therapeutic Activity 8-22 mins  Deborah Chalk, PT, DPT  Acute Rehabilitation Services Pager [phone] Office [phone]

## 2018-06-27 NOTE — Progress Notes (Signed)
[facility] PROGRESS NOTE  Allison Bailey ZOX:[id number] DOB: 29-Dec-1945 DOA: 06/26/2018 PCP: Sharlene Dory, DO  Assessment/Plan:  Stroke/Weakness. Left leg and right arm weaker.unable to ambulate. MRI with new subacute thalamic infarct as well as worsening of severe chronic demylinating disease.  -MRA -echo -carotid doppler -lipid panel HgA1c -asa -statin -PT -OT -Speech Active Problems:   SCLEROSIS, MULTIPLE. MRI with worsening of severe chronic disease -defer management to neuro -continue home med    Essential hypertension controlled    Cognitive impairment. Answered most questions approptiately during my exam. Baseline unclear. MRI as noted above    Acute lower UTI -follow urine culture -rocephin 11/12/>> -monitor urine output    Hypokalemia. repleted and resolved -monitor  Code Status: full Family Communication: none Disposition Plan: home or may benefit/need higher level of care   Consultants:  [facility] neurology  Procedures:  echo  Antibiotics:  Rocephin 11/12>>  HPI/Subjective: Awake alert. Reports feeling about the same as yesterday   patient admitted with  right upper extremity weakness for approximately 1 week, left lower extremity weakness for approximately 1 day.  Apparently she usually ambulates with a walker, however over the last couple days she has been unable to do so.  On admission she was found to have symptoms of UTI .  MRI was obtained which did show a new left thalamic infarct and worsening severe demylenting disease.  Neurology was consulted for the new left thalamic infarct.  Objective: Vitals:   06/27/18 0736 06/27/18 1000  BP: (!) 155/87 (!) 141/84  Pulse: 90 86  Resp: 18 17  Temp: 98.5 F (36.9 C) 98.5 F (36.9 C)  SpO2: 96% 98%    Intake/Output Summary (Last 24 hours) at 06/27/2018 1046 Last data filed at 06/27/2018 0105 Gross per 24 hour  Intake 440 ml  Output -  Net 440 ml   There were no vitals  filed for this visit.  Exam:   General:  Awake alert in no acute distress  Cardiovascular: rrr no m/g/r trace edema  Respiratory: normal effort respirations shallow distant breath sounds  Abdomen: soft +BS no guarding or rebounding  Musculoskeletal: lower extremity strength 2/5 on left 4/5 on right bilateral UE strength 3/5   Data Reviewed: Basic Metabolic Panel: Recent Labs  Lab 06/26/18 1443 06/26/18 2202 06/27/18 0438  NA 141  --  141  K 3.3*  --  3.6  CL 108  --  111  CO2 25  --  23  GLUCOSE 85  --  84  BUN 15  --  14  CREATININE 0.64 0.65 0.68  CALCIUM 8.9  --  8.4*  MG  --   --  2.0  PHOS  --   --  3.3   Liver Function Tests: No results for input(s): AST, ALT, ALKPHOS, BILITOT, PROT, ALBUMIN in the last 168 hours. No results for input(s): LIPASE, AMYLASE in the last 168 hours. No results for input(s): AMMONIA in the last 168 hours. CBC: Recent Labs  Lab 06/26/18 1443 06/26/18 2202  WBC 8.6 7.2  NEUTROABS 6.5  --   HGB 12.6 11.1*  HCT 42.8 36.4  MCV 84.4 82.0  PLT 276 241   Cardiac Enzymes: No results for input(s): CKTOTAL, CKMB, CKMBINDEX, TROPONINI in the last 168 hours. BNP (last 3 results) No results for input(s): BNP in the last 8760 hours.  ProBNP (last 3 results) No results for input(s): PROBNP in the last 8760 hours.  CBG: No results for input(s): GLUCAP in the  last 168 hours.  No results found for this or any previous visit (from the past 240 hour(s)).   Studies: Ct Head Wo Contrast  Result Date: 06/26/2018 CLINICAL DATA:  Right-sided weakness. EXAM: CT HEAD WITHOUT CONTRAST TECHNIQUE: Contiguous axial images were obtained from the base of the skull through the vertex without intravenous contrast. COMPARISON:  Brain MRI 05/01/2012 FINDINGS: Brain: There is no mass, hemorrhage or extra-axial collection. There is generalized atrophy without lobar predilection. There is bilateral periventricular white matter hypoattenuation, greatest in the  right frontal lobe. Vascular: No abnormal hyperdensity of the major intracranial arteries or dural venous sinuses. No intracranial atherosclerosis. Skull: The visualized skull base, calvarium and extracranial soft tissues are normal. Sinuses/Orbits: No fluid levels or advanced mucosal thickening of the visualized paranasal sinuses. No mastoid or middle ear effusion. The orbits are normal. IMPRESSION: 1. No acute intracranial abnormality. 2. Atrophy and advanced chronic white matter disease, which may be secondary to chronic small vessel ischemia or demyelination. Electronically Signed   By: Deatra Robinson M.D.   On: 06/26/2018 15:18   Mr Allison Bailey ZO Contrast  Result Date: 06/27/2018 CLINICAL DATA:  Worsening RIGHT arm and LEFT leg weakness. History of multiple sclerosis, hypertension hyperlipidemia. EXAM: MRI HEAD WITHOUT AND WITH CONTRAST TECHNIQUE: Multiplanar, multiecho pulse sequences of the brain and surrounding structures were obtained without and with intravenous contrast. CONTRAST:  6 cc Gadavist COMPARISON:  MRI of the head May 01, 2012 and CT HEAD June 26, 2018 FINDINGS: INTRACRANIAL CONTENTS: No reduced diffusion to suggest acute ischemia or hyperacute demyelination. Worsening confluent many lesions demonstrate low T1 signal seen with Tritia Endo holes of demyelination and, demonstrate T2 shine through. Supratentorial white matter FLAIR T2 hyperintensities with cystic components. At least 1 LEFT cerebellar lesion. New LEFT pontine lacunar infarct. Old patchy basal ganglia T2 hyperintensities. Prominent basal ganglia perivascular spaces associated with chronic small vessel ischemic changes. No midline shift, mass effect or masses. Moderate parenchymal brain volume loss. No hydrocephalus. No abnormal extra-axial fluid collections. Stable 11 mm LEFT frontal homogeneously enhancing meningioma. VASCULAR: Normal major intracranial vascular flow voids present at skull base. SKULL AND UPPER CERVICAL SPINE:  No abnormal sellar expansion. No suspicious calvarial bone marrow signal. Craniocervical junction maintained. SINUSES/ORBITS: Mild paranasal sinus mucosal thickening. Mastoid air cells are well aerated. Included ocular globes and orbital contents are non-suspicious. OTHER: None. IMPRESSION: 1. Worsening severe chronic demyelination superimposed on chronic small vessel ischemic changes. 2. New nonacute LEFT thalamus lacunar infarct. 3. Moderate parenchymal brain volume loss, progressed from prior examination. Electronically Signed   By: Awilda Metro M.D.   On: 06/27/2018 02:14    Scheduled Meds: .  stroke: mapping our early stages of recovery book   Does not apply Once  . enoxaparin (LOVENOX) injection  40 mg Subcutaneous Q24H   Continuous Infusions: . sodium chloride 50 mL/hr at 06/27/18 0902  . cefTRIAXone (ROCEPHIN)  IV 1 g (06/26/18 2320)    Principal Problem:   Stroke [facility]) Active Problems:   SCLEROSIS, MULTIPLE   Cognitive impairment   Essential hypertension   Acute lower UTI   Hypokalemia  Time spent: 40 minutes    [facility] M NP [facility]  If 7PM-7AM, please contact night-coverage at [email or web address], password [facility] 06/27/2018, 10:46 AM  LOS: 0 days

## 2018-06-27 NOTE — Progress Notes (Signed)
*  Preliminary Results* Carotid artery duplex has been completed. Bilateral internal carotid arteries are 1-39%. Vertebral arteries are patent with antegrade flow.  06/27/2018 2:19 PM  Allison Bailey Clare Gandy

## 2018-06-27 NOTE — Progress Notes (Signed)
  Echocardiogram 2D Echocardiogram has been performed.  Allison Bailey 06/27/2018, 2:21 PM

## 2018-06-27 NOTE — Care Management Note (Addendum)
Case Management Note  Patient Details  Name: Allison Bailey MRN: [id number] Date of Birth: March 31, 1946  Subjective/Objective:     Pt admitted with subacute thalamic infarct. She is from home with son and husband. Per son patient has 24 hour supervision.  DME: 3 in 1, walker, wheelchair, shower chair. Pt doesn't have any issues with obtaining her medications, however hasnt been taking medications. Son states her neurologist dropped her and she hasnt gotten any meds he was prescribing her recently. Pt doesn't have any issues with transportation.               Action/Plan: PT recommending SNF. CM following for d/c disposition.  Expected Discharge Date:                  Expected Discharge Plan:  Skilled Nursing Facility  In-House Referral:  Clinical Social Work  Discharge planning Services     Post Acute Care Choice:    Choice offered to:     DME Arranged:    DME Agency:     HH Arranged:    HH Agency:     Status of Service:  In process, will continue to follow  If discussed at Long Length of Stay Meetings, dates discussed:    Additional Comments:  Kermit Balo, RN 06/27/2018, 3:07 PM

## 2018-06-28 LAB — URINE CULTURE: Culture: <10000 — AB

## 2018-06-28 MED ORDER — AMLODIPINE BESYLATE 5 MG PO TABS
5.0000 mg | ORAL_TABLET | Freq: Every day | ORAL | Status: DC
  Administered 2018-06-28 – 2018-06-29 (×2): 5 mg via ORAL
  Filled 2018-06-28 (×2): qty 1

## 2018-06-28 NOTE — Progress Notes (Signed)
Physical Therapy Treatment Patient Details Name: Allison Bailey MRN: [id number] DOB: 05-09-1946 Today's Date: 06/28/2018    History of Present Illness Pt is a 72 y.o female with PMH consisting of MS, HTN, hyperlipidemia, and tuberculosis presents to ED with RUE and LLE weakness. MRI on 06/27/2018 shows chronic demyelination, nonacute L thalamus lacunar infarct, and parenchymal brain volume loss.    PT Comments    Pt presents supine, HOB elevated, and alert. Pt states willingness to participate in PT. Pt is overall 2+ mod A for bed mobility and 2+ max A for transfers. Pt shows improved sitting balance, demonstrating ability to orient self to midline with cueing. Pt shows decreased cognitive function, including memory of previous PT session and awareness. Due to deficits described and fluctuating home environment, SNF is still recommended.  Pt would benefit from continued PT in order to improve strength, balance and functional mobility.     Follow Up Recommendations  SNF     Equipment Recommendations  None recommended by PT    Recommendations for Other Services       Precautions / Restrictions Precautions Precautions: Fall Restrictions Weight Bearing Restrictions: No    Mobility  Bed Mobility Overal bed mobility: Needs Assistance Bed Mobility: Supine to Sit     Supine to sit: Mod assist;+2 for physical assistance;HOB elevated     General bed mobility comments: Pt required 2 person assist to maintain truck elevation and BLE. Pt able to provide support with RUE after verbal cueing given. Pt also requires max assist during scooting towards EOB.   Transfers Overall transfer level: Needs assistance Equipment used: 2 person hand held assist Transfers: Squat Pivot Transfers     Squat pivot transfers: +2 physical assistance;Max assist     General transfer comment: 2 person max A needed for power initiation and stability. Pt required therapist positioning of BLE to maintain  stable BoS, with sustained guarding of LLE to prevent. Pt transferred to her R side. Verbal cueing was given for hand placement and technique throughout.     Ambulation/Gait                 Stairs             Wheelchair Mobility    Modified Rankin (Stroke Patients Only) Modified Rankin (Stroke Patients Only) Pre-Morbid Rankin Score: Moderately severe disability Modified Rankin: Severe disability     Balance Overall balance assessment: Needs assistance Sitting-balance support: Bilateral upper extremity supported;Feet supported Sitting balance-Leahy Scale: Poor Sitting balance - Comments: Pt initially requires mod A to maintain sitting balance, progressing to min guard with verbal cues. Pt demonstrates increased understanding of midline orientation, with self-correction after verbal cues given.  Postural control: Right lateral lean Standing balance support: Bilateral upper extremity supported;During functional activity Standing balance-Leahy Scale: Zero Standing balance comment: Maximum support needed in partial standing. Unable to complete full stand.                            Cognition Arousal/Alertness: Awake/alert Behavior During Therapy: WFL for tasks assessed/performed Overall Cognitive Status: Impaired/Different from baseline Area of Impairment: Following commands;Safety/judgement;Awareness;Problem solving;Memory;Attention                   Current Attention Level: Sustained Memory: Decreased recall of precautions;Decreased short-term memory Following Commands: Follows one step commands consistently;Follows one step commands with increased time;Follows multi-step commands inconsistently Safety/Judgement: Decreased awareness of safety;Decreased awareness of deficits Awareness: Emergent Problem Solving:  Slow processing;Requires verbal cues;Requires tactile cues;Difficulty sequencing;Decreased initiation General Comments: Pt remembers previous  PT encounter. However, pt does not recall the tasks performed that day.       Exercises      General Comments        Pertinent Vitals/Pain Pain Assessment: Faces Faces Pain Scale: Hurts even more Pain Location: RLE Pain Descriptors / Indicators: Aching;Stabbing Pain Intervention(s): Monitored during session;Limited activity within patient's tolerance;Repositioned    Home Living Family/patient expects to be discharged to:: Private residence Living Arrangements: Spouse/significant other;Other relatives Available Help at Discharge: Family;Available 24 hours/day Type of Home: House Home Access: Stairs to enter Entrance Stairs-Rails: None Home Layout: One level Home Equipment: Wheelchair - manual      Prior Function Level of Independence: Needs assistance  Gait / Transfers Assistance Needed: recently using w/c due to increased weakness, baseline walks with a walker       PT Goals (current goals can now be found in the care plan section) Acute Rehab PT Goals Patient Stated Goal: to return home  PT Goal Formulation: With patient Time For Goal Achievement: 07/11/18 Potential to Achieve Goals: Fair Progress towards PT goals: Progressing toward goals    Frequency    Min 4X/week      PT Plan Current plan remains appropriate    Co-evaluation PT/OT/SLP Co-Evaluation/Treatment: Yes Reason for Co-Treatment: Complexity of the patient's impairments (multi-system involvement);For patient/therapist safety;To address functional/ADL transfers PT goals addressed during session: Mobility/safety with mobility;Balance OT goals addressed during session: ADL's and self-care      AM-PAC PT "6 Clicks" Daily Activity  Outcome Measure  Difficulty turning over in bed (including adjusting bedclothes, sheets and blankets)?: Unable Difficulty moving from lying on back to sitting on the side of the bed? : Unable Difficulty sitting down on and standing up from a chair with arms (e.g.,  wheelchair, bedside commode, etc,.)?: Unable Help needed moving to and from a bed to chair (including a wheelchair)?: Total Help needed walking in hospital room?: Total Help needed climbing 3-5 steps with a railing? : Total 6 Click Score: 6    End of Session Equipment Utilized During Treatment: Gait belt Activity Tolerance: Patient tolerated treatment well Patient left: in chair;with call bell/phone within reach;with chair alarm set Nurse Communication: Mobility status PT Visit Diagnosis: Unsteadiness on feet (R26.81);Muscle weakness (generalized) (M62.81);Other symptoms and signs involving the nervous system (R29.898)     Time: 7829-5621 PT Time Calculation (min) (ACUTE ONLY): 23 min  Charges:  $Therapeutic Activity: 8-22 mins                     Rolm Bookbinder, SPT Acute Rehab [phone] (pager) [phone] (office)   Ritaj Dullea 06/28/2018, 4:33 PM

## 2018-06-28 NOTE — Progress Notes (Signed)
PROGRESS NOTE    TEIGHLOR KORSON  ZOX:[id number] DOB: May 21, 1946 DOA: 06/26/2018 PCP: Sharlene Dory, DO   Brief Narrative:  HPI on 06/26/2018 by Dr. Leavy Cella Johnson-Pitts RAYNELL UPTON is a 72 y.o. female with medical history significant of multiple sclerosis hypertension presents with weakness.  Per medical records patient has had worsening right-arm  Weakness, left leg weakness.  She is been using her wheelchair.  Found to have a urinary tract infection.  She was given IV Rocephin.  Neurology recommended MRI to evaluate for any new lesions.  Head CT nonacute Assessment & Plan   Generalized weakness -possible secondary to MS vs UTI vs old stroke -Per H&P, patient presented with RUE and LLE weakness -upon questioning patient this morning, she could not remember her presenting symptoms but stated that she felt weak. -MRI brain showed worsening severe chronic demyelination superimposed on chronic small vessel ischemic changes.  New nonacute left thalamus lacunar infarct.  Moderate parenchymal brain volume loss, progressed from prior examination. -MRA head was normal. -Echocardiogram EF 60 to 65%, grade 1 diastolic dysfunction, mild AI, trace MR -Carotid doppler bilateral ICA stenosis consistent with 1 to 39%.  Bilateral vertebral arteries demonstrate antegrade flow. -LDL 63, hemoglobin A1c 5.3 -Discussed with neurology, Dr. Laurence Slate, recommended no further inpatient work-up.  Patient will need to follow-up with neurology as an outpatient.  Feels symptoms may be due to chronic stroke or her MS or even UTI.  No need for IV steroids as patient does not have any enhancing lesions.  Continue to treat underlying infection.  Start aspirin 81 mg and Lipitor for secondary stroke prevention and hyperlipidemia. -PT consulted and recommended SNF. It seem that patient needed multiple cues during PT session.   Chronic stroke -MRI as above, likely incidental finding. -Work-up as above. -PT  recommending SNF.  Essential hypertension -will start on low dose amlodipine   Acute UTI -Patient endorses foul-smelling urine. -Urine culture less than 10,000 colonies of significant growth -Continue ceftriaxone  Hypokalemia -Resolved with repletion   DVT Prophylaxis  lovenox  Code Status: Full  Family Communication: None at bedside  Disposition Plan: Admitted. Pending SNF given weakness.  Consultants Neurology  Procedures  Echocardiogram Carotid Doppler  Antibiotics   Anti-infectives (From admission, onward)   Start     Dose/Rate Route Frequency Ordered Stop   06/27/18 0000  cefTRIAXone (ROCEPHIN) 1 g in sodium chloride 0.9 % 100 mL IVPB     1 g 200 mL/hr over 30 Minutes Intravenous Every 24 hours 06/26/18 2118     06/26/18 1545  cefTRIAXone (ROCEPHIN) 1 g in sodium chloride 0.9 % 100 mL IVPB     1 g 200 mL/hr over 30 Minutes Intravenous  Once 06/26/18 1544 06/26/18 1706      Subjective:   Jaskiran Merendino seen and examined today.  He is to have some weakness.  Denies current chest pain, shortness of breath, abdominal pain, nausea or vomiting, diarrhea or constipation, dizziness or headache.  Objective:   Vitals:   06/27/18 2003 06/28/18 0000 06/28/18 0506 06/28/18 0814  BP: 138/89 (!) 158/88 (!) 163/95 (!) 152/96  Pulse: 94 76 80 84  Resp: 18 18 18 18   Temp: 98.8 F (37.1 C) 97.9 F (36.6 C) 98 F (36.7 C) 98.1 F (36.7 C)  TempSrc: Oral Oral Oral Oral  SpO2: 97% 100% 99% 98%  Height:        Intake/Output Summary (Last 24 hours) at 06/28/2018 1153 Last data filed at 06/28/2018 0900 Gross  per 24 hour  Intake 240 ml  Output -  Net 240 ml   There were no vitals filed for this visit.  Exam  General: Well developed, well nourished, NAD, appears stated age  HEENT: NCAT, mucous membranes moist.   Neck: Supple  Cardiovascular: S1 S2 auscultated, 2/6 SEM, RRR  Respiratory: Clear to auscultation bilaterally with equal chest rise  Abdomen: Soft,  nontender, nondistended, + bowel sounds  Extremities: warm dry without cyanosis clubbing or edema  Neuro: AAOx3, RUE/ RLE 4/5  Psych: Normal affect and demeanor    Data Reviewed: I have personally reviewed following labs and imaging studies  CBC: Recent Labs  Lab 06/26/18 1443 06/26/18 2202  WBC 8.6 7.2  NEUTROABS 6.5  --   HGB 12.6 11.1*  HCT 42.8 36.4  MCV 84.4 82.0  PLT 276 241   Basic Metabolic Panel: Recent Labs  Lab 06/26/18 1443 06/26/18 2202 06/27/18 0438  NA 141  --  141  K 3.3*  --  3.6  CL 108  --  111  CO2 25  --  23  GLUCOSE 85  --  84  BUN 15  --  14  CREATININE 0.64 0.65 0.68  CALCIUM 8.9  --  8.4*  MG  --   --  2.0  PHOS  --   --  3.3   GFR: CrCl cannot be calculated (Unknown ideal weight.). Liver Function Tests: No results for input(s): AST, ALT, ALKPHOS, BILITOT, PROT, ALBUMIN in the last 168 hours. No results for input(s): LIPASE, AMYLASE in the last 168 hours. No results for input(s): AMMONIA in the last 168 hours. Coagulation Profile: No results for input(s): INR, PROTIME in the last 168 hours. Cardiac Enzymes: No results for input(s): CKTOTAL, CKMB, CKMBINDEX, TROPONINI in the last 168 hours. BNP (last 3 results) No results for input(s): PROBNP in the last 8760 hours. HbA1C: Recent Labs    06/27/18 0809  HGBA1C 5.3   CBG: No results for input(s): GLUCAP in the last 168 hours. Lipid Profile: Recent Labs    06/27/18 0438  CHOL 152  HDL 73  LDLCALC 63  TRIG 81  CHOLHDL 2.1   Thyroid Function Tests: No results for input(s): TSH, T4TOTAL, FREET4, T3FREE, THYROIDAB in the last 72 hours. Anemia Panel: No results for input(s): VITAMINB12, FOLATE, FERRITIN, TIBC, IRON, RETICCTPCT in the last 72 hours. Urine analysis:    Component Value Date/Time   COLORURINE AMBER (A) 06/26/2018 1421   APPEARANCEUR HAZY (A) 06/26/2018 1421   LABSPEC >1.030 (H) 06/26/2018 1421   PHURINE 6.0 06/26/2018 1421   GLUCOSEU NEGATIVE 06/26/2018 1421    HGBUR MODERATE (A) 06/26/2018 1421   HGBUR negative 01/22/2009 1136   BILIRUBINUR NEGATIVE 06/26/2018 1421   BILIRUBINUR n 04/04/2012 1252   KETONESUR >80 (A) 06/26/2018 1421   PROTEINUR NEGATIVE 06/26/2018 1421   UROBILINOGEN 0.2 04/04/2012 1252   UROBILINOGEN 0.2 01/22/2009 1136   NITRITE POSITIVE (A) 06/26/2018 1421   LEUKOCYTESUR MODERATE (A) 06/26/2018 1421   Sepsis Labs: @LABRCNTIP (procalcitonin:4,lacticidven:4)  ) Recent Results (from the past 240 hour(s))  Culture, Urine     Status: Abnormal   Collection Time: 06/27/18  5:05 AM  Result Value Ref Range Status   Specimen Description URINE, CLEAN CATCH  Final   Special Requests NONE  Final   Culture (A)  Final    <10,000 COLONIES/mL INSIGNIFICANT GROWTH Performed at [facility], [address], [place], [place] [address]    Report Status 06/28/2018 FINAL  Final  Radiology Studies: Ct Head Wo Contrast  Result Date: 06/26/2018 CLINICAL DATA:  Right-sided weakness. EXAM: CT HEAD WITHOUT CONTRAST TECHNIQUE: Contiguous axial images were obtained from the base of the skull through the vertex without intravenous contrast. COMPARISON:  Brain MRI 05/01/2012 FINDINGS: Brain: There is no mass, hemorrhage or extra-axial collection. There is generalized atrophy without lobar predilection. There is bilateral periventricular white matter hypoattenuation, greatest in the right frontal lobe. Vascular: No abnormal hyperdensity of the major intracranial arteries or dural venous sinuses. No intracranial atherosclerosis. Skull: The visualized skull base, calvarium and extracranial soft tissues are normal. Sinuses/Orbits: No fluid levels or advanced mucosal thickening of the visualized paranasal sinuses. No mastoid or middle ear effusion. The orbits are normal. IMPRESSION: 1. No acute intracranial abnormality. 2. Atrophy and advanced chronic white matter disease, which may be secondary to chronic small vessel ischemia or demyelination.  Electronically Signed   By: Deatra Robinson M.D.   On: 06/26/2018 15:18   Mr Laqueta Jean ZO Contrast  Result Date: 06/27/2018 CLINICAL DATA:  Worsening RIGHT arm and LEFT leg weakness. History of multiple sclerosis, hypertension hyperlipidemia. EXAM: MRI HEAD WITHOUT AND WITH CONTRAST TECHNIQUE: Multiplanar, multiecho pulse sequences of the brain and surrounding structures were obtained without and with intravenous contrast. CONTRAST:  6 cc Gadavist COMPARISON:  MRI of the head May 01, 2012 and CT HEAD June 26, 2018 FINDINGS: INTRACRANIAL CONTENTS: No reduced diffusion to suggest acute ischemia or hyperacute demyelination. Worsening confluent many lesions demonstrate low T1 signal seen with black holes of demyelination and, demonstrate T2 shine through. Supratentorial white matter FLAIR T2 hyperintensities with cystic components. At least 1 LEFT cerebellar lesion. New LEFT pontine lacunar infarct. Old patchy basal ganglia T2 hyperintensities. Prominent basal ganglia perivascular spaces associated with chronic small vessel ischemic changes. No midline shift, mass effect or masses. Moderate parenchymal brain volume loss. No hydrocephalus. No abnormal extra-axial fluid collections. Stable 11 mm LEFT frontal homogeneously enhancing meningioma. VASCULAR: Normal major intracranial vascular flow voids present at skull base. SKULL AND UPPER CERVICAL SPINE: No abnormal sellar expansion. No suspicious calvarial bone marrow signal. Craniocervical junction maintained. SINUSES/ORBITS: Mild paranasal sinus mucosal thickening. Mastoid air cells are well aerated. Included ocular globes and orbital contents are non-suspicious. OTHER: None. IMPRESSION: 1. Worsening severe chronic demyelination superimposed on chronic small vessel ischemic changes. 2. New nonacute LEFT thalamus lacunar infarct. 3. Moderate parenchymal brain volume loss, progressed from prior examination. Electronically Signed   By: Awilda Metro M.D.    On: 06/27/2018 02:14   Mr Maxine Glenn Head Wo Contrast  Result Date: 06/27/2018 CLINICAL DATA:  Stroke follow-up EXAM: MRA HEAD WITHOUT CONTRAST TECHNIQUE: Angiographic images of the Circle of Willis were obtained using MRA technique without intravenous contrast. COMPARISON:  Brain MRI 06/27/2018 FINDINGS: ANTERIOR CIRCULATION: --Intracranial internal carotid arteries: Normal. --Anterior cerebral arteries: Normal. Both A1 segments are present. Patent anterior communicating artery. --Middle cerebral arteries: Normal. --Posterior communicating arteries: Present bilaterally. POSTERIOR CIRCULATION: --Basilar artery: Normal. --Posterior cerebral arteries: Normal. --Superior cerebellar arteries: Normal. --Inferior cerebellar arteries: Normal anterior and posterior inferior cerebellar arteries. IMPRESSION: Normal intracranial MRA. Electronically Signed   By: Deatra Robinson M.D.   On: 06/27/2018 21:29   Vas US Carotid (at [facility])  Result Date: 06/27/2018 Carotid Arterial Duplex Study Indications: CVA. Performing Technologist: Blanch Media  Examination Guidelines: A complete evaluation includes B-mode imaging, spectral Doppler, color Doppler, and power Doppler as needed of all accessible portions of each vessel. Bilateral testing is considered an integral part of a  complete examination. Limited examinations for reoccurring indications may be performed as noted.  Right Carotid Findings: +----------+--------+--------+--------+------------+--------+           PSV cm/sEDV cm/sStenosisDescribe    Comments +----------+--------+--------+--------+------------+--------+ CCA Prox  117     10              heterogenous         +----------+--------+--------+--------+------------+--------+ CCA Distal84      14              heterogenous         +----------+--------+--------+--------+------------+--------+ ICA Prox  49      6       1-39%   heterogenous          +----------+--------+--------+--------+------------+--------+ ICA Distal61      18                                   +----------+--------+--------+--------+------------+--------+ ECA       84                                           +----------+--------+--------+--------+------------+--------+ +----------+--------+-------+--------+-------------------+           PSV cm/sEDV cmsDescribeArm Pressure (mmHG) +----------+--------+-------+--------+-------------------+ [id number]                                        +----------+--------+-------+--------+-------------------+ +---------+--------+--+--------+--+---------+ VertebralPSV cm/s68EDV cm/s15Antegrade +---------+--------+--+--------+--+---------+  Left Carotid Findings: +----------+--------+--------+--------+------------+--------+           PSV cm/sEDV cm/sStenosisDescribe    Comments +----------+--------+--------+--------+------------+--------+ CCA Prox  91      8               heterogenous         +----------+--------+--------+--------+------------+--------+ CCA Distal76      9               heterogenous         +----------+--------+--------+--------+------------+--------+ ICA Prox  45      9       1-39%   heterogenous         +----------+--------+--------+--------+------------+--------+ ICA Distal57      18                                   +----------+--------+--------+--------+------------+--------+ ECA       75      9                                    +----------+--------+--------+--------+------------+--------+ +----------+--------+--------+--------+-------------------+ SubclavianPSV cm/sEDV cm/sDescribeArm Pressure (mmHG) +----------+--------+--------+--------+-------------------+           62                                          +----------+--------+--------+--------+-------------------+ +---------+--------+--+--------+--+---------+ VertebralPSV cm/s76EDV  cm/s18Antegrade +---------+--------+--+--------+--+---------+  Summary: Right Carotid: Velocities in the right ICA are consistent with a 1-39% stenosis. Left Carotid: Velocities in the left ICA are consistent with a 1-39% stenosis. Vertebrals: Bilateral vertebral arteries demonstrate antegrade flow. *See table(s) above  for measurements and observations.  Electronically signed by Delia Heady MD on 06/27/2018 at 3:50:50 PM.    Final      Scheduled Meds: . aspirin  325 mg Oral Daily  . atorvastatin  80 mg Oral q1800  . enoxaparin (LOVENOX) injection  40 mg Subcutaneous Q24H   Continuous Infusions: . sodium chloride 50 mL/hr at 06/28/18 1140  . cefTRIAXone (ROCEPHIN)  IV 1 g (06/28/18 0039)     LOS: 1 day   Time Spent in minutes   30 minutes  Kito Cuffe D.O. on 06/28/2018 at 11:53 AM  Between 7am to 7pm - Please see pager noted on [email or web address]  After 7pm go to [email or web address]  And look for the night coverage person covering for me after hours  [facility] Office  [phone]

## 2018-06-28 NOTE — Progress Notes (Signed)
PT Progress Note for Charges    06/28/18 1600  PT General Charges  $$ ACUTE PT VISIT 1 Visit  PT Treatments  $Therapeutic Activity 8-22 mins  Deborah Chalk, [place], DPT  Acute Rehabilitation Services Pager [phone] Office [phone]

## 2018-06-28 NOTE — Evaluation (Signed)
Occupational Therapy Evaluation Patient Details Name: Allison Bailey MRN: [id number] DOB: 03-Aug-1946 Today's Date: 06/28/2018    History of Present Illness Pt is a 72 y.o female with PMH consisting of MS, HTN, hyperlipidemia, and tuberculosis presents to ED with RUE and LLE weakness. MRI on 06/27/2018 shows chronic demyelination, nonacute L thalamus lacunar infarct, and parenchymal brain volume loss.   Clinical Impression   Pt admitted with the above diagnoses and presents with below problem list. Pt will benefit from continued acute OT to address the below listed deficits and maximize independence with basic ADLs prior to d/c to venue below. Pt has been recently using a w/c for mobility as weakness has worsened, prior to that pt was ambulating with a walker per chart review. Unclear what her baseline assist level is with ADLs, no family present during session. Pt currently needing +2 max A-total A for LB ADLs, max A for UB ADLs in supported sitting, max +2 squat-pivot for toilet transfers.      Follow Up Recommendations  SNF    Equipment Recommendations  Other (comment)(defer to next venue)    Recommendations for Other Services       Precautions / Restrictions Precautions Precautions: Fall Restrictions Weight Bearing Restrictions: No      Mobility Bed Mobility Overal bed mobility: Needs Assistance Bed Mobility: Supine to Sit     Supine to sit: Mod assist;+2 for physical assistance;HOB elevated     General bed mobility comments: Assist to advance BLE and powerup trunk to EOB position. Assist to scoot hips forwards. Bed pad utilized.   Transfers Overall transfer level: Needs assistance Equipment used: 2 person hand held assist             General transfer comment: partial stand only this session as part of squat-pivot transfer. max 2 A.    Balance Overall balance assessment: Needs assistance Sitting-balance support: Bilateral upper extremity supported;Feet  supported Sitting balance-Leahy Scale: Poor Sitting balance - Comments: Pt requires mod A to remain sitting, progressing to min guard with verbal cueing. Pt initially unable to orient self to midline. Multimodal cueing and tactile facilitation given to maintain upright position. Postural control: Right lateral lean Standing balance support: Bilateral upper extremity supported;During functional activity Standing balance-Leahy Scale: Zero                             ADL either performed or assessed with clinical judgement   ADL Overall ADL's : Needs assistance/impaired Eating/Feeding: Maximal assistance;Sitting   Grooming: Maximal assistance   Upper Body Bathing: Maximal assistance;Sitting   Lower Body Bathing: Total assistance;Bed level   Upper Body Dressing : Maximal assistance;Sitting   Lower Body Dressing: Total assistance   Toilet Transfer: Maximal assistance;+2 for physical assistance;Squat-pivot;BSC   Toileting- Clothing Manipulation and Hygiene: Maximal assistance;Sitting/lateral lean;+2 for physical assistance   Tub/ Shower Transfer: Maximal assistance;+2 for physical assistance;Squat-pivot;3 in 1;Rolling walker     General ADL Comments: Pt completed bed mobility, sat EOB a few minutes with mod to min guard to mod A. Squat pivot to recliner. Supported sitting needed for UB ADLs.      Vision         Perception     Praxis      Pertinent Vitals/Pain Pain Assessment: Faces Faces Pain Scale: Hurts even more Pain Location: RLE Pain Descriptors / Indicators: Stabbing;Aching Pain Intervention(s): Limited activity within patient's tolerance;Monitored during session;Repositioned     Hand Dominance Right  Extremity/Trunk Assessment Upper Extremity Assessment Upper Extremity Assessment: Generalized weakness;RUE deficits/detail RUE Deficits / Details: grossly 3/5 elbow and distal, 2/5 at shoulder level RUE Coordination: decreased fine motor;decreased  gross motor   Lower Extremity Assessment Lower Extremity Assessment: Defer to PT evaluation       Communication Communication Communication: No difficulties   Cognition Arousal/Alertness: Awake/alert Behavior During Therapy: WFL for tasks assessed/performed Overall Cognitive Status: No family/caregiver present to determine baseline cognitive functioning Area of Impairment: Following commands;Safety/judgement;Problem solving;Awareness;Memory                     Memory: Decreased recall of precautions;Decreased short-term memory Following Commands: Follows one step commands consistently;Follows one step commands with increased time;Follows multi-step commands inconsistently Safety/Judgement: Decreased awareness of deficits;Decreased awareness of safety   Problem Solving: Slow processing;Requires verbal cues;Requires tactile cues;Difficulty sequencing;Decreased initiation     General Comments       Exercises     Shoulder Instructions      Home Living Family/patient expects to be discharged to:: Private residence Living Arrangements: Spouse/significant other;Other relatives Available Help at Discharge: Family;Available 24 hours/day Type of Home: House Home Access: Stairs to enter [employer] of Steps: 4 Entrance Stairs-Rails: None Home Layout: One level     Bathroom Shower/Tub: Producer, television/film/video: Standard Bathroom Accessibility: Yes   Home Equipment: Wheelchair - manual          Prior Functioning/Environment Level of Independence: Needs assistance  Gait / Transfers Assistance Needed: recently using w/c due to increased weakness, baseline walks with a walker              OT Problem List: Decreased strength;Decreased activity tolerance;Impaired balance (sitting and/or standing);Decreased range of motion;Decreased coordination;Decreased cognition;Decreased safety awareness;Decreased knowledge of use of DME or AE;Decreased  knowledge of precautions;Impaired tone;Impaired UE functional use;Pain      OT Treatment/Interventions: Self-care/ADL training;Energy conservation;DME and/or AE instruction;Therapeutic activities;Cognitive remediation/compensation;Patient/family education;Balance training;Neuromuscular education;Therapeutic exercise    OT Goals(Current goals can be found in the care plan section) Acute Rehab OT Goals Patient Stated Goal: to return to normal  OT Goal Formulation: With patient Time For Goal Achievement: 07/12/18 Potential to Achieve Goals: Good ADL Goals Pt Will Perform Grooming: with min assist;sitting Pt Will Perform Upper Body Bathing: with min assist;sitting Pt Will Perform Lower Body Bathing: with max assist;sit to/from stand;sitting/lateral leans Pt Will Transfer to Toilet: with max assist;stand pivot transfer;bedside commode Pt Will Perform Toileting - Clothing Manipulation and hygiene: with max assist;sit to/from stand;sitting/lateral leans  OT Frequency: Min 2X/week   Barriers to D/C:            Co-evaluation PT/OT/SLP Co-Evaluation/Treatment: Yes Reason for Co-Treatment: Complexity of the patient's impairments (multi-system involvement);Necessary to address cognition/behavior during functional activity;For patient/therapist safety;To address functional/ADL transfers   OT goals addressed during session: ADL's and self-care      AM-PAC PT "6 Clicks" Daily Activity     Outcome Measure Help from another person eating meals?: A Lot Help from another person taking care of personal grooming?: A Lot Help from another person toileting, which includes using toliet, bedpan, or urinal?: Total Help from another person bathing (including washing, rinsing, drying)?: Total Help from another person to put on and taking off regular upper body clothing?: A Lot Help from another person to put on and taking off regular lower body clothing?: Total 6 Click Score: 9   End of Session Equipment  Utilized During Treatment: Gait belt  Activity Tolerance: Patient tolerated  treatment well;Patient limited by fatigue Patient left: in chair;with call bell/phone within reach;with chair alarm set  OT Visit Diagnosis: Unsteadiness on feet (R26.81);Muscle weakness (generalized) (M62.81);Pain;Other abnormalities of gait and mobility (R26.89);Other symptoms and signs involving cognitive function                Time: 1610-9604 OT Time Calculation (min): 20 min Charges:  OT General Charges $OT Visit: 1 Visit OT Evaluation $OT Eval Moderate Complexity: 1 Mod  Raynald Kemp, OT Acute Rehabilitation Services Pager: [phone] Office: [phone]   Pilar Grammes 06/28/2018, 1:41 PM

## 2018-06-28 NOTE — Progress Notes (Signed)
CSW met with patient this morning to discuss SNF placement. Patient said she thought about it and is agreeable to SNF placement. Patient deferred to her husband for specific facility choice, said he'd be able to research options and pick one for her. CSW attempted to contact husband; unable to leave a voicemail. CSW called son and left a voicemail. CSW received call back from husband to discuss bed offers. Patient's husband requested [facility]. CSW confirmed bed availability and asked facility to start insurance authorization.  CSW to follow.  Laveda Abbe, [place] Clinical Social Worker [phone]

## 2018-06-29 MED ORDER — ATORVASTATIN CALCIUM 10 MG PO TABS: 10.0000 mg | ORAL_TABLET | Freq: Every day | ORAL | 0 refills | Status: DC

## 2018-06-29 MED ORDER — CEPHALEXIN 500 MG PO CAPS
500.0000 mg | ORAL_CAPSULE | Freq: Two times a day (BID) | ORAL | Status: DC
  Administered 2018-06-29: 500 mg via ORAL
  Filled 2018-06-29: qty 1

## 2018-06-29 MED ORDER — CEPHALEXIN 500 MG PO CAPS: 500.0000 mg | ORAL_CAPSULE | Freq: Two times a day (BID) | ORAL | 0 refills | Status: DC

## 2018-06-29 MED ORDER — ASPIRIN EC 81 MG PO TBEC: 81.0000 mg | DELAYED_RELEASE_TABLET | Freq: Every day | ORAL | 0 refills | Status: AC

## 2018-06-29 MED ORDER — AMLODIPINE BESYLATE 5 MG PO TABS: 5.0000 mg | ORAL_TABLET | Freq: Every day | ORAL | 0 refills | Status: DC

## 2018-06-29 NOTE — Progress Notes (Signed)
Physical Therapy Treatment Patient Details Name: Allison Bailey MRN: [id number] DOB: 10-21-1945 Today's Date: 06/29/2018    History of Present Illness Pt is a 71 y.o female with PMH consisting of MS, HTN, hyperlipidemia, and tuberculosis presents to ED with RUE and LLE weakness. MRI on 06/27/2018 shows chronic demyelination, nonacute L thalamus lacunar infarct, and parenchymal brain volume loss.    PT Comments    Pt presents HOB elevated, supine and alert. Pt states willingness to participate in PT. Pt is overall Max A x2 for bed mobility and transfers. Pt shows slightly improved sitting balance with min A needed. Pt also demonstrated the ability to correct for midline orientation with verbal cueing. Pt requires BLE placement and facilitation before scooting due to decreased proprioception, weakness and LLE maintaining in rigid extension requiring manual physical assistance. Continued recommendation of SNF as patient requires manual assistance of 2 for all mobility. Pt would contiue to benefit from skilled PT in order to increase strength and functional mobility.     Follow Up Recommendations  SNF     Equipment Recommendations  None recommended by PT    Recommendations for Other Services       Precautions / Restrictions Precautions Precautions: Fall Restrictions Weight Bearing Restrictions: No    Mobility  Bed Mobility Overal bed mobility: Needs Assistance Bed Mobility: Rolling;Sidelying to Sit;Sit to Supine Rolling: Max assist Sidelying to sit: Max assist;+2 for physical assistance   Sit to supine: +2 for physical assistance;Max assist   General bed mobility comments: Pt able to initiate rolling to the R by pulling on R bed rail. Unable to move into sidelying without LLE facilitation and verbal cueing. Pt requires BLE support and trunk support going from sidelying to sitting and sitting to supine.   Transfers Overall transfer level: Needs assistance Equipment used:  None Transfers: Lateral/Scoot Transfers          Lateral/Scoot Transfers: +2 physical assistance;Max assist General transfer comment: Pt completes 3 R lateral scoots with max A x2. Pt able to WB on BUE, but little force is generated through them. Pt requires leg repositioning through manual facilitation after each scoot. Pt states fear of falling and decreased proprioception in the LLE.   Ambulation/Gait                 Stairs             Wheelchair Mobility    Modified Rankin (Stroke Patients Only) Modified Rankin (Stroke Patients Only) Pre-Morbid Rankin Score: Moderately severe disability Modified Rankin: Severe disability     Balance Overall balance assessment: Needs assistance Sitting-balance support: Bilateral upper extremity supported;Feet supported Sitting balance-Leahy Scale: Poor Sitting balance - Comments: Pt able to progress sitting balance with BE support and min A for safety. Pt able to correct to midline with verbal cueing.  Postural control: Right lateral lean                                  Cognition Arousal/Alertness: Awake/alert Behavior During Therapy: WFL for tasks assessed/performed Overall Cognitive Status: Impaired/Different from baseline Area of Impairment: Attention;Memory;Following commands;Safety/judgement;Awareness;Problem solving                   Current Attention Level: Sustained Memory: Decreased short-term memory;Decreased recall of precautions Following Commands: Follows one step commands consistently;Follows one step commands with increased time;Follows multi-step commands inconsistently Safety/Judgement: Decreased awareness of safety;Decreased awareness of deficits Awareness: Emergent  Problem Solving: Slow processing;Decreased initiation;Difficulty sequencing;Requires verbal cues;Requires tactile cues        Exercises      General Comments        Pertinent Vitals/Pain Pain Assessment:  No/denies pain    Home Living                      Prior Function            PT Goals (current goals can now be found in the care plan section) Acute Rehab PT Goals Patient Stated Goal: to return home  PT Goal Formulation: With patient Time For Goal Achievement: 07/11/18 Potential to Achieve Goals: Fair Progress towards PT goals: Progressing toward goals    Frequency    Min 4X/week      PT Plan Current plan remains appropriate    Co-evaluation              AM-PAC PT "6 Clicks" Daily Activity  Outcome Measure  Difficulty turning over in bed (including adjusting bedclothes, sheets and blankets)?: Unable Difficulty moving from lying on back to sitting on the side of the bed? : Unable Difficulty sitting down on and standing up from a chair with arms (e.g., wheelchair, bedside commode, etc,.)?: Unable Help needed moving to and from a bed to chair (including a wheelchair)?: Total Help needed walking in hospital room?: Total Help needed climbing 3-5 steps with a railing? : Total 6 Click Score: 6    End of Session   Activity Tolerance: Patient tolerated treatment well Patient left: in bed;with call bell/phone within reach;with bed alarm set;with nursing/sitter in room Nurse Communication: Mobility status PT Visit Diagnosis: Unsteadiness on feet (R26.81);Muscle weakness (generalized) (M62.81);Other symptoms and signs involving the nervous system (R29.898)     Time: 1610-9604 PT Time Calculation (min) (ACUTE ONLY): 16 min  Charges:  $Therapeutic Activity: 8-22 mins                     Rolm Bookbinder, SPT Acute Rehab [phone] (pager) [phone] (office)   Taffy Delconte 06/29/2018, 3:13 PM

## 2018-06-29 NOTE — Progress Notes (Signed)
Pt being discharged from hospital per orders from MD. Pt educated on discharge instructions by RN Victorino Dike. All questions and concerns were addressed. Pt's IV was removed prior to discharge. Pt exited hospital via wheelchair accompanied by staff.

## 2018-06-29 NOTE — Discharge Summary (Signed)
Physician Discharge Summary  Allison Bailey TMA:[id number] DOB: [birth year] DOA: 06/26/2018  PCP: Sharlene Dory, DO  Admit date: 06/26/2018 Discharge date: 06/29/2018  Time spent: 45 minutes  Recommendations for Outpatient Follow-up:  Patient will be discharged to home with home health services.  Patient will need to follow up with primary care provider within one week of discharge.  Follow up with neurology in one week. Patient should continue medications as prescribed.  Patient should follow a heart healthy diet.   Discharge Diagnoses:  Generalized weakness Chronic stroke Essential hypertension Acute UTI Hypokalemia  Discharge Condition: Stable  Diet recommendation: heart healthy  There were no vitals filed for this visit.  History of present illness:  on 06/26/2018 by Dr. Leavy Cella Johnson-Pitts Audelia Acton Reavesis a 72 y.o.femalewith medical history significant ofmultiple sclerosis hypertension presents with weakness. Per medical records patient has had worsening right-arm Weakness,left leg weakness. She is been using her wheelchair. Found to have a urinary tract infection. She was given IV Rocephin. Neurology recommended MRI to evaluate for any new lesions. Head CT nonacute  Hospital Course:  Generalized weakness -possible secondary to MS vs UTI vs old stroke -Per H&P, patient presented with RUE and LLE weakness -upon questioning patient this morning, she could not remember her presenting symptoms but stated that she felt weak. -MRI brain showed worsening severe chronic demyelination superimposed on chronic small vessel ischemic changes.  New nonacute left thalamus lacunar infarct.  Moderate parenchymal brain volume loss, progressed from prior examination. -MRA head was normal. -Echocardiogram EF 60 to 65%, grade 1 diastolic dysfunction, mild AI, trace MR -Carotid doppler bilateral ICA stenosis consistent with 1 to 39%.  Bilateral vertebral arteries  demonstrate antegrade flow. -LDL 63, hemoglobin A1c 5.3 -Discussed with neurology, Dr. Laurence Slate, recommended no further inpatient work-up.  Patient will need to follow-up with neurology as an outpatient.  Feels symptoms may be due to chronic stroke or her MS or even UTI.  No need for IV steroids as patient does not have any enhancing lesions.  Continue to treat underlying infection.  Start aspirin 81 mg and Lipitor for secondary stroke prevention and hyperlipidemia. -PT consulted and recommended SNF. It seems that patient needed multiple cues during PT session.  -Son has now decided that patient will go home with home health sevices -case management consulted   Chronic stroke -MRI as above, likely incidental finding. -Work-up as above. -PT recommending SNF.  Essential hypertension -BP more controlled -continue amlodipine   Acute UTI -Patient endorses foul-smelling urine. -Urine culture less than 10,000 colonies of significant growth -Currently on ceftriaxone- transitioned to oral keflex  Hypokalemia -Resolved with repletion  Consultants Neurology  Procedures  Echocardiogram Carotid Doppler  Discharge Exam: Vitals:   06/29/18 0829 06/29/18 1202  BP: (!) 163/101 138/86  Pulse: 81 81  Resp: 18 18  Temp: 97.6 F (36.4 C) 98.1 F (36.7 C)  SpO2: 99% 99%     General: Well developed, well nourished, NAD, appears stated age  HEENT: NCAT, mucous membranes moist.  Neck: Supple  Cardiovascular: S1 S2 auscultated, SEM, RRR  Respiratory: Clear to auscultation bilaterally with equal chest rise  Abdomen: Soft, nontender, nondistended, + bowel sounds  Extremities: warm dry without cyanosis clubbing or edema  Neuro: AAOx3, right sided weakness  Psych: Normal affect and demeanor  Discharge Instructions Discharge Instructions    Discharge instructions   Complete by:  As directed    Patient will be discharged to home with home health services.  Patient will need to  follow up with primary care provider within one week of discharge.  Follow up with neurology in one week. Patient should continue medications as prescribed.  Patient should follow a heart healthy diet.     Allergies as of 06/29/2018   No Known Allergies     Medication List    TAKE these medications   acetaminophen 500 MG tablet Commonly known as:  TYLENOL Take 1,000 mg by mouth every 6 (six) hours as needed for mild pain.   amLODipine 5 MG tablet Commonly known as:  NORVASC Take 1 tablet (5 mg total) by mouth daily. Start taking on:  06/30/2018   aspirin EC 81 MG tablet Take 1 tablet (81 mg total) by mouth daily.   atorvastatin 10 MG tablet Commonly known as:  LIPITOR Take 1 tablet (10 mg total) by mouth daily at 6 PM.   cephALEXin 500 MG capsule Commonly known as:  KEFLEX Take 1 capsule (500 mg total) by mouth every 12 (twelve) hours.   tiZANidine 2 MG tablet Commonly known as:  ZANAFLEX Take 1 tablet (2 mg total) by mouth every 8 (eight) hours as needed for muscle spasms.      No Known Allergies  Contact information for follow-up providers    Sharlene Dory, DO.   Specialty:  Family Medicine Contact information: [address] [place] [address] [phone]        Asa Lente, MD. Schedule an appointment as soon as possible for a visit in 1 week(s).   Specialty:  Neurology Why:  Multiple sclerosis  Contact information: [address] [place] [address] [phone]            Contact information for after-discharge care    Destination    [facility] Preferred SNF .   Service:  Skilled Nursing Contact information: [address] [place] [address] [phone]                   The results of significant diagnostics from this hospitalization (including imaging, microbiology, ancillary and laboratory) are listed below for reference.    Significant Diagnostic Studies: Ct  Head Wo Contrast  Result Date: 06/26/2018 CLINICAL DATA:  Right-sided weakness. EXAM: CT HEAD WITHOUT CONTRAST TECHNIQUE: Contiguous axial images were obtained from the base of the skull through the vertex without intravenous contrast. COMPARISON:  Brain MRI 05/01/2012 FINDINGS: Brain: There is no mass, hemorrhage or extra-axial collection. There is generalized atrophy without lobar predilection. There is bilateral periventricular white matter hypoattenuation, greatest in the right frontal lobe. Vascular: No abnormal hyperdensity of the major intracranial arteries or dural venous sinuses. No intracranial atherosclerosis. Skull: The visualized skull base, calvarium and extracranial soft tissues are normal. Sinuses/Orbits: No fluid levels or advanced mucosal thickening of the visualized paranasal sinuses. No mastoid or middle ear effusion. The orbits are normal. IMPRESSION: 1. No acute intracranial abnormality. 2. Atrophy and advanced chronic white matter disease, which may be secondary to chronic small vessel ischemia or demyelination. Electronically Signed   By: Deatra Robinson M.D.   On: 06/26/2018 15:18   Mr Laqueta Jean KG Contrast  Result Date: 06/27/2018 CLINICAL DATA:  Worsening RIGHT arm and LEFT leg weakness. History of multiple sclerosis, hypertension hyperlipidemia. EXAM: MRI HEAD WITHOUT AND WITH CONTRAST TECHNIQUE: Multiplanar, multiecho pulse sequences of the brain and surrounding structures were obtained without and with intravenous contrast. CONTRAST:  6 cc Gadavist COMPARISON:  MRI of the head May 01, 2012 and CT HEAD  June 26, 2018 FINDINGS: INTRACRANIAL CONTENTS: No reduced diffusion to suggest acute ischemia or hyperacute demyelination. Worsening confluent many lesions demonstrate low T1 signal seen with black holes of demyelination and, demonstrate T2 shine through. Supratentorial white matter FLAIR T2 hyperintensities with cystic components. At least 1 LEFT cerebellar lesion. New LEFT  pontine lacunar infarct. Old patchy basal ganglia T2 hyperintensities. Prominent basal ganglia perivascular spaces associated with chronic small vessel ischemic changes. No midline shift, mass effect or masses. Moderate parenchymal brain volume loss. No hydrocephalus. No abnormal extra-axial fluid collections. Stable 11 mm LEFT frontal homogeneously enhancing meningioma. VASCULAR: Normal major intracranial vascular flow voids present at skull base. SKULL AND UPPER CERVICAL SPINE: No abnormal sellar expansion. No suspicious calvarial bone marrow signal. Craniocervical junction maintained. SINUSES/ORBITS: Mild paranasal sinus mucosal thickening. Mastoid air cells are well aerated. Included ocular globes and orbital contents are non-suspicious. OTHER: None. IMPRESSION: 1. Worsening severe chronic demyelination superimposed on chronic small vessel ischemic changes. 2. New nonacute LEFT thalamus lacunar infarct. 3. Moderate parenchymal brain volume loss, progressed from prior examination. Electronically Signed   By: Awilda Metro M.D.   On: 06/27/2018 02:14   Mr Maxine Glenn Head Wo Contrast  Result Date: 06/27/2018 CLINICAL DATA:  Stroke follow-up EXAM: MRA HEAD WITHOUT CONTRAST TECHNIQUE: Angiographic images of the Circle of Willis were obtained using MRA technique without intravenous contrast. COMPARISON:  Brain MRI 06/27/2018 FINDINGS: ANTERIOR CIRCULATION: --Intracranial internal carotid arteries: Normal. --Anterior cerebral arteries: Normal. Both A1 segments are present. Patent anterior communicating artery. --Middle cerebral arteries: Normal. --Posterior communicating arteries: Present bilaterally. POSTERIOR CIRCULATION: --Basilar artery: Normal. --Posterior cerebral arteries: Normal. --Superior cerebellar arteries: Normal. --Inferior cerebellar arteries: Normal anterior and posterior inferior cerebellar arteries. IMPRESSION: Normal intracranial MRA. Electronically Signed   By: Deatra Robinson M.D.   On: 06/27/2018  21:29   Vas US Carotid (at [facility])  Result Date: 06/27/2018 Carotid Arterial Duplex Study Indications: CVA. Performing Technologist: Blanch Media  Examination Guidelines: A complete evaluation includes B-mode imaging, spectral Doppler, color Doppler, and power Doppler as needed of all accessible portions of each vessel. Bilateral testing is considered an integral part of a complete examination. Limited examinations for reoccurring indications may be performed as noted.  Right Carotid Findings: +----------+--------+--------+--------+------------+--------+           PSV cm/sEDV cm/sStenosisDescribe    Comments +----------+--------+--------+--------+------------+--------+ CCA Prox  117     10              heterogenous         +----------+--------+--------+--------+------------+--------+ CCA Distal84      14              heterogenous         +----------+--------+--------+--------+------------+--------+ ICA Prox  49      6       1-39%   heterogenous         +----------+--------+--------+--------+------------+--------+ ICA Distal61      18                                   +----------+--------+--------+--------+------------+--------+ ECA       84                                           +----------+--------+--------+--------+------------+--------+ +----------+--------+-------+--------+-------------------+  PSV cm/sEDV cmsDescribeArm Pressure (mmHG) +----------+--------+-------+--------+-------------------+ [id number]                                        +----------+--------+-------+--------+-------------------+ +---------+--------+--+--------+--+---------+ VertebralPSV cm/s68EDV cm/s15Antegrade +---------+--------+--+--------+--+---------+  Left Carotid Findings: +----------+--------+--------+--------+------------+--------+           PSV cm/sEDV cm/sStenosisDescribe    Comments  +----------+--------+--------+--------+------------+--------+ CCA Prox  91      8               heterogenous         +----------+--------+--------+--------+------------+--------+ CCA Distal76      9               heterogenous         +----------+--------+--------+--------+------------+--------+ ICA Prox  45      9       1-39%   heterogenous         +----------+--------+--------+--------+------------+--------+ ICA Distal57      18                                   +----------+--------+--------+--------+------------+--------+ ECA       75      9                                    +----------+--------+--------+--------+------------+--------+ +----------+--------+--------+--------+-------------------+ SubclavianPSV cm/sEDV cm/sDescribeArm Pressure (mmHG) +----------+--------+--------+--------+-------------------+           62                                          +----------+--------+--------+--------+-------------------+ +---------+--------+--+--------+--+---------+ VertebralPSV cm/s76EDV cm/s18Antegrade +---------+--------+--+--------+--+---------+  Summary: Right Carotid: Velocities in the right ICA are consistent with a 1-39% stenosis. Left Carotid: Velocities in the left ICA are consistent with a 1-39% stenosis. Vertebrals: Bilateral vertebral arteries demonstrate antegrade flow. *See table(s) above for measurements and observations.  Electronically signed by Delia Heady MD on 06/27/2018 at 3:50:50 PM.    Final     Microbiology: Recent Results (from the past 240 hour(s))  Culture, Urine     Status: Abnormal   Collection Time: 06/27/18  5:05 AM  Result Value Ref Range Status   Specimen Description URINE, CLEAN CATCH  Final   Special Requests NONE  Final   Culture (A)  Final    <10,000 COLONIES/mL INSIGNIFICANT GROWTH Performed at [facility], [address], [place], [place] [address]    Report Status 06/28/2018 FINAL  Final      Labs: Basic Metabolic Panel: Recent Labs  Lab 06/26/18 1443 06/26/18 2202 06/27/18 0438  NA 141  --  141  K 3.3*  --  3.6  CL 108  --  111  CO2 25  --  23  GLUCOSE 85  --  84  BUN 15  --  14  CREATININE 0.64 0.65 0.68  CALCIUM 8.9  --  8.4*  MG  --   --  2.0  PHOS  --   --  3.3   Liver Function Tests: No results for input(s): AST, ALT, ALKPHOS, BILITOT, PROT, ALBUMIN in the last 168 hours. No results for input(s): LIPASE, AMYLASE in the last 168 hours. No results for input(s): AMMONIA  in the last 168 hours. CBC: Recent Labs  Lab 06/26/18 1443 06/26/18 2202  WBC 8.6 7.2  NEUTROABS 6.5  --   HGB 12.6 11.1*  HCT 42.8 36.4  MCV 84.4 82.0  PLT 276 241   Cardiac Enzymes: No results for input(s): CKTOTAL, CKMB, CKMBINDEX, TROPONINI in the last 168 hours. BNP: BNP (last 3 results) No results for input(s): BNP in the last 8760 hours.  ProBNP (last 3 results) No results for input(s): PROBNP in the last 8760 hours.  CBG: No results for input(s): GLUCAP in the last 168 hours.     Signed:  Edsel Petrin  [facility] 06/29/2018, 1:51 PM

## 2018-06-29 NOTE — Progress Notes (Signed)
PROGRESS NOTE    Allison Bailey  ZOX:[id number] DOB: [birth year] DOA: 06/26/2018 PCP: Sharlene Dory, DO   Brief Narrative:  HPI on 06/26/2018 by Dr. Leavy Cella Johnson-Pitts Allison Bailey is a 72 y.o. female with medical history significant of multiple sclerosis hypertension presents with weakness.  Per medical records patient has had worsening right-arm  Weakness, left leg weakness.  She is been using her wheelchair.  Found to have a urinary tract infection.  She was given IV Rocephin.  Neurology recommended MRI to evaluate for any new lesions.  Head CT nonacute  Interim history Admitted for generalized weakness and pending SNF. Assessment & Plan   Generalized weakness -possible secondary to MS vs UTI vs old stroke -Per H&P, patient presented with RUE and LLE weakness -upon questioning patient this morning, she could not remember her presenting symptoms but stated that she felt weak. -MRI brain showed worsening severe chronic demyelination superimposed on chronic small vessel ischemic changes.  New nonacute left thalamus lacunar infarct.  Moderate parenchymal brain volume loss, progressed from prior examination. -MRA head was normal. -Echocardiogram EF 60 to 65%, grade 1 diastolic dysfunction, mild AI, trace MR -Carotid doppler bilateral ICA stenosis consistent with 1 to 39%.  Bilateral vertebral arteries demonstrate antegrade flow. -LDL 63, hemoglobin A1c 5.3 -Discussed with neurology, Dr. Laurence Slate, recommended no further inpatient work-up.  Patient will need to follow-up with neurology as an outpatient.  Feels symptoms may be due to chronic stroke or her MS or even UTI.  No need for IV steroids as patient does not have any enhancing lesions.  Continue to treat underlying infection.  Start aspirin 81 mg and Lipitor for secondary stroke prevention and hyperlipidemia. -PT consulted and recommended SNF. It seems that patient needed multiple cues during PT session.   Chronic stroke -MRI  as above, likely incidental finding. -Work-up as above. -PT recommending SNF.  Essential hypertension -BP more controlled -continue amlodipine   Acute UTI -Patient endorses foul-smelling urine. -Urine culture less than 10,000 colonies of significant growth -Currently on ceftriaxone- will transition to oral   Hypokalemia -Resolved with repletion  DVT Prophylaxis  lovenox  Code Status: Full  Family Communication: None at bedside  Disposition Plan: Admitted. Pending SNF given weakness.  Consultants Neurology  Procedures  Echocardiogram Carotid Doppler  Antibiotics   Anti-infectives (From admission, onward)   Start     Dose/Rate Route Frequency Ordered Stop   06/27/18 0000  cefTRIAXone (ROCEPHIN) 1 g in sodium chloride 0.9 % 100 mL IVPB     1 g 200 mL/hr over 30 Minutes Intravenous Every 24 hours 06/26/18 2118     06/26/18 1545  cefTRIAXone (ROCEPHIN) 1 g in sodium chloride 0.9 % 100 mL IVPB     1 g 200 mL/hr over 30 Minutes Intravenous  Once 06/26/18 1544 06/26/18 1706      Subjective:   Allison Bailey seen and examined today. Continue to have weakness, but feeling better. Denies current chest pain, shortness of breath, abdominal pain, N/V/D/C, dizziness, headache.   Objective:   Vitals:   06/28/18 2339 06/29/18 0416 06/29/18 0829 06/29/18 1202  BP: 138/79 (!) 158/96 (!) 163/101 138/86  Pulse:  88 81 81  Resp: 18 18 18 18   Temp: 97.9 F (36.6 C) 98.2 F (36.8 C) 97.6 F (36.4 C) 98.1 F (36.7 C)  TempSrc: Oral Oral Oral Oral  SpO2:  100% 99% 99%  Height:        Intake/Output Summary (Last 24 hours) at 06/29/2018 1219  Last data filed at 06/29/2018 1100 Gross per 24 hour  Intake 680 ml  Output 1750 ml  Net -1070 ml   There were no vitals filed for this visit.  Exam  General: Well developed, well nourished, NAD, appears stated age  HEENT: NCAT, mucous membranes moist.   Neck: Supple  Cardiovascular: S1 S2 auscultated, 2/6 SEM,  RRR  Respiratory: Clear to auscultation bilaterally with equal chest rise  Abdomen: Soft, nontender, nondistended, + bowel sounds  Extremities: warm dry without cyanosis clubbing or edema  Neuro: AAOx3, Weakness of the right side  Psych:Appropriate mood and affect, pleasant   Data Reviewed: I have personally reviewed following labs and imaging studies  CBC: Recent Labs  Lab 06/26/18 1443 06/26/18 2202  WBC 8.6 7.2  NEUTROABS 6.5  --   HGB 12.6 11.1*  HCT 42.8 36.4  MCV 84.4 82.0  PLT 276 241   Basic Metabolic Panel: Recent Labs  Lab 06/26/18 1443 06/26/18 2202 06/27/18 0438  NA 141  --  141  K 3.3*  --  3.6  CL 108  --  111  CO2 25  --  23  GLUCOSE 85  --  84  BUN 15  --  14  CREATININE 0.64 0.65 0.68  CALCIUM 8.9  --  8.4*  MG  --   --  2.0  PHOS  --   --  3.3   GFR: CrCl cannot be calculated (Unknown ideal weight.). Liver Function Tests: No results for input(s): AST, ALT, ALKPHOS, BILITOT, PROT, ALBUMIN in the last 168 hours. No results for input(s): LIPASE, AMYLASE in the last 168 hours. No results for input(s): AMMONIA in the last 168 hours. Coagulation Profile: No results for input(s): INR, PROTIME in the last 168 hours. Cardiac Enzymes: No results for input(s): CKTOTAL, CKMB, CKMBINDEX, TROPONINI in the last 168 hours. BNP (last 3 results) No results for input(s): PROBNP in the last 8760 hours. HbA1C: Recent Labs    06/27/18 0809  HGBA1C 5.3   CBG: No results for input(s): GLUCAP in the last 168 hours. Lipid Profile: Recent Labs    06/27/18 0438  CHOL 152  HDL 73  LDLCALC 63  TRIG 81  CHOLHDL 2.1   Thyroid Function Tests: No results for input(s): TSH, T4TOTAL, FREET4, T3FREE, THYROIDAB in the last 72 hours. Anemia Panel: No results for input(s): VITAMINB12, FOLATE, FERRITIN, TIBC, IRON, RETICCTPCT in the last 72 hours. Urine analysis:    Component Value Date/Time   COLORURINE AMBER (A) 06/26/2018 1421   APPEARANCEUR HAZY (A)  06/26/2018 1421   LABSPEC >1.030 (H) 06/26/2018 1421   PHURINE 6.0 06/26/2018 1421   GLUCOSEU NEGATIVE 06/26/2018 1421   HGBUR MODERATE (A) 06/26/2018 1421   HGBUR negative 01/22/2009 1136   BILIRUBINUR NEGATIVE 06/26/2018 1421   BILIRUBINUR n 04/04/2012 1252   KETONESUR >80 (A) 06/26/2018 1421   PROTEINUR NEGATIVE 06/26/2018 1421   UROBILINOGEN 0.2 04/04/2012 1252   UROBILINOGEN 0.2 01/22/2009 1136   NITRITE POSITIVE (A) 06/26/2018 1421   LEUKOCYTESUR MODERATE (A) 06/26/2018 1421   Sepsis Labs: @LABRCNTIP (procalcitonin:4,lacticidven:4)  ) Recent Results (from the past 240 hour(s))  Culture, Urine     Status: Abnormal   Collection Time: 06/27/18  5:05 AM  Result Value Ref Range Status   Specimen Description URINE, CLEAN CATCH  Final   Special Requests NONE  Final   Culture (A)  Final    <10,000 COLONIES/mL INSIGNIFICANT GROWTH Performed at [facility], [address] [place], [place] [address]  Report Status 06/28/2018 FINAL  Final      Radiology Studies: Mr Shirlee Latch IO Contrast  Result Date: 06/27/2018 CLINICAL DATA:  Stroke follow-up EXAM: MRA HEAD WITHOUT CONTRAST TECHNIQUE: Angiographic images of the Circle of Willis were obtained using MRA technique without intravenous contrast. COMPARISON:  Brain MRI 06/27/2018 FINDINGS: ANTERIOR CIRCULATION: --Intracranial internal carotid arteries: Normal. --Anterior cerebral arteries: Normal. Both A1 segments are present. Patent anterior communicating artery. --Middle cerebral arteries: Normal. --Posterior communicating arteries: Present bilaterally. POSTERIOR CIRCULATION: --Basilar artery: Normal. --Posterior cerebral arteries: Normal. --Superior cerebellar arteries: Normal. --Inferior cerebellar arteries: Normal anterior and posterior inferior cerebellar arteries. IMPRESSION: Normal intracranial MRA. Electronically Signed   By: Deatra Robinson M.D.   On: 06/27/2018 21:29   Vas US Carotid (at [facility])  Result Date:  06/27/2018 Carotid Arterial Duplex Study Indications: CVA. Performing Technologist: Blanch Media  Examination Guidelines: A complete evaluation includes B-mode imaging, spectral Doppler, color Doppler, and power Doppler as needed of all accessible portions of each vessel. Bilateral testing is considered an integral part of a complete examination. Limited examinations for reoccurring indications may be performed as noted.  Right Carotid Findings: +----------+--------+--------+--------+------------+--------+           PSV cm/sEDV cm/sStenosisDescribe    Comments +----------+--------+--------+--------+------------+--------+ CCA Prox  117     10              heterogenous         +----------+--------+--------+--------+------------+--------+ CCA Distal84      14              heterogenous         +----------+--------+--------+--------+------------+--------+ ICA Prox  49      6       1-39%   heterogenous         +----------+--------+--------+--------+------------+--------+ ICA Distal61      18                                   +----------+--------+--------+--------+------------+--------+ ECA       84                                           +----------+--------+--------+--------+------------+--------+ +----------+--------+-------+--------+-------------------+           PSV cm/sEDV cmsDescribeArm Pressure (mmHG) +----------+--------+-------+--------+-------------------+ [id number]                                        +----------+--------+-------+--------+-------------------+ +---------+--------+--+--------+--+---------+ VertebralPSV cm/s68EDV cm/s15Antegrade +---------+--------+--+--------+--+---------+  Left Carotid Findings: +----------+--------+--------+--------+------------+--------+           PSV cm/sEDV cm/sStenosisDescribe    Comments +----------+--------+--------+--------+------------+--------+ CCA Prox  91      8                heterogenous         +----------+--------+--------+--------+------------+--------+ CCA Distal76      9               heterogenous         +----------+--------+--------+--------+------------+--------+ ICA Prox  45      9       1-39%   heterogenous         +----------+--------+--------+--------+------------+--------+ ICA Distal57      18                                   +----------+--------+--------+--------+------------+--------+  ECA       75      9                                    +----------+--------+--------+--------+------------+--------+ +----------+--------+--------+--------+-------------------+ SubclavianPSV cm/sEDV cm/sDescribeArm Pressure (mmHG) +----------+--------+--------+--------+-------------------+           62                                          +----------+--------+--------+--------+-------------------+ +---------+--------+--+--------+--+---------+ VertebralPSV cm/s76EDV cm/s18Antegrade +---------+--------+--+--------+--+---------+  Summary: Right Carotid: Velocities in the right ICA are consistent with a 1-39% stenosis. Left Carotid: Velocities in the left ICA are consistent with a 1-39% stenosis. Vertebrals: Bilateral vertebral arteries demonstrate antegrade flow. *See table(s) above for measurements and observations.  Electronically signed by Delia Heady MD on 06/27/2018 at 3:50:50 PM.    Final      Scheduled Meds: . amLODipine  5 mg Oral Daily  . aspirin  325 mg Oral Daily  . atorvastatin  80 mg Oral q1800  . enoxaparin (LOVENOX) injection  40 mg Subcutaneous Q24H   Continuous Infusions: . sodium chloride 50 mL/hr at 06/28/18 1140  . cefTRIAXone (ROCEPHIN)  IV 1 g (06/28/18 2334)     LOS: 2 days   Time Spent in minutes   30 minutes  Reeder Brisby D.O. on 06/29/2018 at 12:19 PM  Between 7am to 7pm - Please see pager noted on [email or web address]  After 7pm go to [email or web address]  And look for the night coverage person covering  for me after hours  [facility] Office  [phone]

## 2018-06-29 NOTE — Care Management Important Message (Signed)
Important Message  Patient Details  Name: Allison Bailey MRN: [id number] Date of Birth: 1946-04-05   Medicare Important Message Given:  Yes    Dorena Bodo 06/29/2018, 3:27 PM

## 2018-06-29 NOTE — Discharge Instructions (Signed)
Stroke Prevention Some health problems and behaviors may make it more likely for you to have a stroke. Below are ways to lessen your risk of having a stroke.  Be active for at least 30 minutes on most or all days.  Do not smoke. Try not to be around others who smoke.  Do not drink too much alcohol. ? Do not have more than 2 drinks a day if you are a man. ? Do not have more than 1 drink a day if you are a woman and are not pregnant.  Eat healthy foods, such as fruits and vegetables. If you were put on a specific diet, follow the diet as told.  Keep your cholesterol levels under control through diet and medicines. Look for foods that are low in saturated fat, trans fat, cholesterol, and are high in fiber.  If you have diabetes, follow all diet plans and take your medicine as told.  Ask your doctor if you need treatment to lower your blood pressure. If you have high blood pressure (hypertension), follow all diet plans and take your medicine as told by your doctor.  If you are 26-49 years old, have your blood pressure checked every 3-5 years. If you are age 54 or older, have your blood pressure checked every year.  Keep a healthy weight. Eat foods that are low in calories, salt, saturated fat, trans fat, and cholesterol.  Do not take drugs.  Avoid birth control pills, if this applies. Talk to your doctor about the risks of taking birth control pills.  Talk to your doctor if you have sleep problems (sleep apnea).  Take all medicine as told by your doctor. ? You may be told to take aspirin or blood thinner medicine. Take this medicine as told by your doctor. ? Understand your medicine instructions.  Make sure any other conditions you have are being taken care of.  Get help right away if:  You suddenly lose feeling (you feel numb) or have weakness in your face, arm, or leg.  Your face or eyelid hangs down to one side.  You suddenly feel confused.  You have trouble talking  (aphasia) or understanding what people are saying.  You suddenly have trouble seeing in one or both eyes.  You suddenly have trouble walking.  You are dizzy.  You lose your balance or your movements are clumsy (uncoordinated).  You suddenly have a very bad headache and you do not know the cause.  You have new chest pain.  Your heart feels like it is fluttering or skipping a beat (irregular heartbeat). Do not wait to see if the symptoms above go away. Get help right away. Call your local emergency services (911 in [place] Do not drive yourself to the hospital. This information is not intended to replace advice given to you by your health care provider. Make sure you discuss any questions you have with your health care provider. Document Released: 01/31/2012 Document Revised: 01/07/2016 Document Reviewed: 02/01/2013 Elsevier Interactive Patient Education  2018 [employer]. Weakness Weakness is a lack of strength. You may feel weak all over your body (generalized), or you may feel weak in one specific part of your body (focal). There are many potential causes of weakness. Sometimes, the cause of your weakness may not be known. Some causes of weakness can be serious, so it is important to see your doctor. Follow these instructions at home:  Rest as needed.  Try to get enough sleep. Talk to your doctor about  how much sleep you need each night.  Take over-the-counter and prescription medicines only as told by your doctor.  Eat a healthy, well-balanced diet. This includes: ? Proteins to build muscles, such as lean meats and fish. ? Fresh fruits and vegetables. ? Carbohydrates to boost energy, such as whole grains.  Drink enough fluid to keep your pee (urine) clear or pale yellow.  Do strength exercises, such as arm curls and leg raises, for 30 minutes at least 2 days a week or as told by your doctor.  Think about working with a physical therapist or trainer to help you get  stronger.  Keep all follow-up visits as told by your doctor. This is important. Contact a doctor if:  Your weakness does not get better or it gets worse.  Your weakness affects your ability to: ? Think clearly. ? Do your normal daily activities. Get help right away if:  You have sudden weakness.  You have trouble breathing or shortness of breath.  You have problems with your vision.  You have trouble talking or swallowing.  You have trouble standing or walking.  You have chest pain.  You are light-headed.  You pass out (lose consciousness). This information is not intended to replace advice given to you by your health care provider. Make sure you discuss any questions you have with your health care provider. Document Released: 07/14/2008 Document Revised: 08/27/2015 Document Reviewed: 05/22/2015 Elsevier Interactive Patient Education  [employer].

## 2018-06-29 NOTE — Progress Notes (Signed)
PT Progress Note for Charges    06/29/18 1500  PT General Charges  $$ ACUTE PT VISIT 1 Visit  PT Treatments  $Therapeutic Activity 8-22 mins  Deborah Chalk, [place], DPT  Acute Rehabilitation Services Pager [phone] Office [phone]

## 2018-06-29 NOTE — Care Management Note (Signed)
Case Management Note  Patient Details  Name: Allison Bailey MRN: [id number] Date of Birth: 1946-06-25  Subjective/Objective:                    Action/Plan: Family has decided to take patient home with [facility] services. CM provided choice to patients son and he selected [facility]. Lupita Leash with [facility] notified and accepted the referral.  Son to provide transport home.   I have discussed the patient's current level of function related to non acute CVA with the patient and son.  They acknowledge understanding of this and feel they can provide the level of care the patient will need at home.      Expected Discharge Date:  06/29/18               Expected Discharge Plan:  Skilled Nursing Facility  In-House Referral:  Clinical Social Work  Discharge planning Services  CM Consult  Post Acute Care Choice:  Home Health Choice offered to:  Patient, Adult Children  DME Arranged:    DME Agency:     HH Arranged:  PT, Nurse's Aide, Social Work [employer] Agency:  [facility]  Status of Service:  Completed, signed off  If discussed at [employer], dates discussed:    Additional Comments:  Kermit Balo, RN 06/29/2018, 2:35 PM

## 2018-06-29 NOTE — Clinical Social Work Note (Addendum)
Insurance authorization still pending.  Allison Bailey, CSW [phone]  1:56 pm Per RNCM, plan to return home with home health today. SNF aware.  CSW signing off.   Allison Bailey, CSW [phone]

## 2018-07-01 DIAGNOSIS — G35 Multiple sclerosis: Secondary | ICD-10-CM | POA: Diagnosis not present

## 2018-07-01 DIAGNOSIS — E785 Hyperlipidemia, unspecified: Secondary | ICD-10-CM | POA: Diagnosis not present

## 2018-07-01 DIAGNOSIS — Z9181 History of falling: Secondary | ICD-10-CM | POA: Diagnosis not present

## 2018-07-01 DIAGNOSIS — I69344 Monoplegia of lower limb following cerebral infarction affecting left non-dominant side: Secondary | ICD-10-CM | POA: Diagnosis not present

## 2018-07-01 DIAGNOSIS — I69331 Monoplegia of upper limb following cerebral infarction affecting right dominant side: Secondary | ICD-10-CM | POA: Diagnosis not present

## 2018-07-01 DIAGNOSIS — I10 Essential (primary) hypertension: Secondary | ICD-10-CM | POA: Diagnosis not present

## 2018-07-01 DIAGNOSIS — Z87891 Personal history of nicotine dependence: Secondary | ICD-10-CM | POA: Diagnosis not present

## 2018-07-02 ENCOUNTER — Ambulatory Visit: Payer: Medicare Other | Admitting: Neurology

## 2018-07-02 ENCOUNTER — Telehealth: Payer: Self-pay | Admitting: Neurology

## 2018-07-02 ENCOUNTER — Telehealth: Payer: Self-pay | Admitting: Family Medicine

## 2018-07-02 ENCOUNTER — Encounter: Payer: Self-pay | Admitting: Neurology

## 2018-07-02 VITALS — BP 130/88 | HR 85 | Ht 64.5 in

## 2018-07-02 DIAGNOSIS — R531 Weakness: Secondary | ICD-10-CM | POA: Diagnosis not present

## 2018-07-02 DIAGNOSIS — E538 Deficiency of other specified B group vitamins: Secondary | ICD-10-CM

## 2018-07-02 DIAGNOSIS — R7989 Other specified abnormal findings of blood chemistry: Secondary | ICD-10-CM

## 2018-07-02 DIAGNOSIS — R32 Unspecified urinary incontinence: Secondary | ICD-10-CM | POA: Diagnosis not present

## 2018-07-02 DIAGNOSIS — R4189 Other symptoms and signs involving cognitive functions and awareness: Secondary | ICD-10-CM

## 2018-07-02 DIAGNOSIS — G35 Multiple sclerosis: Secondary | ICD-10-CM | POA: Diagnosis not present

## 2018-07-02 DIAGNOSIS — R269 Unspecified abnormalities of gait and mobility: Secondary | ICD-10-CM

## 2018-07-02 DIAGNOSIS — R200 Anesthesia of skin: Secondary | ICD-10-CM | POA: Insufficient documentation

## 2018-07-02 MED ORDER — OXYBUTYNIN CHLORIDE 5 MG PO TABS: 5.0000 mg | ORAL_TABLET | Freq: Two times a day (BID) | ORAL | 5 refills | Status: DC

## 2018-07-02 MED ORDER — BACLOFEN 10 MG PO TABS: ORAL_TABLET | ORAL | 5 refills | Status: DC

## 2018-07-02 NOTE — Telephone Encounter (Signed)
Hospital followup scheduled 07/05/2018

## 2018-07-02 NOTE — Telephone Encounter (Addendum)
Allison Bailey the patients PT called to give an update and request orders for the patient. Patient was in the hospital for a PT evaluation yesterday. Patient was recommended for short time skilled nursing, patient and family declined. Patient would benefit from home health 2 times a week for 3 weeks for functional mobility and fall risk reduction. Would benefit for referral for occupational therapy reduction for ADL. Having a home health aid to assist with bathing. Frequency also 2 times a week for 3 weeks. Patient would also benefit from a hospital bed. The patient will need documentation in the next office visit stating that this is appropriate. The following medicines were missing from the home, atorvastatin, tylenol, aspirin, cephalaxin, amlodipine. Please call and advise.

## 2018-07-02 NOTE — Progress Notes (Signed)
[facility]  PATIENT: Allison Bailey DOB: Nov 21, 1945  REFERRING DOCTOR OR PCP:  Arva Chafe SOURCE: Patient, notes from recent hospitalization, imaging and laboratory reports, MRI images personally reviewed from PACS  _________________________________   HISTORICAL  CHIEF COMPLAINT:  Chief Complaint  Patient presents with  . New Patient (Initial Visit)    RM 3 with Babs Sciara, daughter-in-law. New MS patient. Recently discharged from MS on 06/29/18. In wheelchair. Non-ambulatory.   . Gait Problem    In wheelchair in office today  . Fall    Has had between 20-30 falls in last year. No significant injuries.     HISTORY OF PRESENT ILLNESS:  I had the pleasure seeing patient, Allison Bailey, at the [facility] at [facility] for neurologic consultation regarding her multiple sclerosis..  She is a 72 yo woman who was diagnosed with MS around 14 after she presented with reduced gait, severe enough to require a wheelchair.  She was hospitalized and had studies performed and was given the diagnosis of multiple sclerosis.  Initially due to her weakness she went to rehab.  She was discharged with a walker and was able to get to point where she was using a cane but not back to baseline.  She continued to use a cane for many years but then around 2000 began to use a walker due to progression of the gait disturbance and leg weakness and spasticity.  The right leg would bother her more than the left leg.  About 5 to 6 years ago, she could only go around the house with a walker and would use a wheelchair whenever she was outside.  That was about the time she was last able to climb one step.  Gait has continued to worsen and she now can only go about 15 feet with a walker.  She has no difficulty transferring.    She reports weakness in both legs, right a little more than the left.  She also has spasticity that is severe in the legs.  More recently she has noted a  little bit of difficulty at times with the right hand.   Initially, she was placed on Betaseron.  Over the next 10 to 15 years she was tried on different injectable medications though she is uncertain of their names.  Sometime between 2010 and 2013 she stopped taking medications.  Her last brain MRI before this year was performed in 2013.  I personally reviewed the MRIs from 2013 and 2019.  They show a severe extent of T2/FLAIR hypertense signal changes in the hemispheres with cystic periventricular foci and juxtacortical and deep white matter lesions.  There is moderate atrophy and corpus callosal atrophy.  She has a couple foci in the pons and one in the cerebellum.  There is only minimal change between 2013 and 2019.  The foci are consistent with demyelinating MS plaque, probably with some superimposed chronic microvascular ischemic changes.  Vitamin B12 was borderline low 2 years ago.  She was hospitalized last week for UTI.   With the infection, she became weaker and was unable to walk.  She was placed on antibiotics and feels she is almost back to where she was before the infection.  She has right > left leg weakness.   No change in hand strength.    She is mostly confined to a wheelchair but can go about 10-15 feet with a walker.      She tires out very quickly if she uses  a walker.     She denies numbness but has a weird sensation in her legs.    She has edema in legs.  She has urinary frequency and urge incontinence.   She denies change in vision and has no diplopia.      She has fatigue but notes no change with She wakes up to use the bathroom every 1-2 hours but quickly falls back asleep.   Mood is doing well.   She denies any problems with her thinking but family notes reduced focus and attention and short term memory.     REVIEW OF SYSTEMS: Constitutional: No fevers, chills, sweats, or change in appetite.   She has some insomnia mostly due to nocturia.  She has fatigue. Eyes: No visual  changes, double vision, eye pain Ear, nose and throat: No hearing loss, ear pain, nasal congestion, sore throat Cardiovascular: No chest pain, palpitations Respiratory: No shortness of breath at rest or with exertion.   No wheezes GastrointestinaI: No nausea, vomiting, diarrhea, abdominal pain, fecal incontinence Genitourinary:She has urinary frequency, urgency and occasional incontinence.  She has nocturia.   Musculoskeletal: No neck pain, back pain.  Sometimes she gets muscle pain Integumentary: No rash, pruritus, skin lesions Neurological: as above Psychiatric: No depression at this time.  No anxiety Endocrine: No palpitations, diaphoresis, change in appetite, change in weigh or increased thirst Hematologic/Lymphatic: No anemia, purpura, petechiae. Allergic/Immunologic: No itchy/runny eyes, nasal congestion, recent allergic reactions, rashes  ALLERGIES: No Known Allergies  HOME MEDICATIONS:  Current Outpatient Medications:  .  acetaminophen (TYLENOL) 500 MG tablet, Take 1,000 mg by mouth every 6 (six) hours as needed for mild pain., Disp: , Rfl:  .  amLODipine (NORVASC) 5 MG tablet, Take 1 tablet (5 mg total) by mouth daily., Disp: 30 tablet, Rfl: 0 .  aspirin EC 81 MG tablet, Take 1 tablet (81 mg total) by mouth daily., Disp: 30 tablet, Rfl: 0 .  atorvastatin (LIPITOR) 10 MG tablet, Take 1 tablet (10 mg total) by mouth daily at 6 PM., Disp: 30 tablet, Rfl: 0 .  cephALEXin (KEFLEX) 500 MG capsule, Take 1 capsule (500 mg total) by mouth every 12 (twelve) hours., Disp: 6 capsule, Rfl: 0 .  tiZANidine (ZANAFLEX) 2 MG tablet, Take 1 tablet (2 mg total) by mouth every 8 (eight) hours as needed for muscle spasms., Disp: 90 tablet, Rfl: 1 .  baclofen (LIORESAL) 10 MG tablet, Take 1/2 to 1 pill po tid, Disp: 90 each, Rfl: 5 .  oxybutynin (DITROPAN) 5 MG tablet, Take 1 tablet (5 mg total) by mouth 2 (two) times daily., Disp: 60 tablet, Rfl: 5  PAST MEDICAL HISTORY: Past Medical History:    Diagnosis Date  . Hyperlipidemia   . Hypertension   . MS (multiple sclerosis) (HCC)   . Stroke (HCC)   . Tuberculosis     PAST SURGICAL HISTORY: Past Surgical History:  Procedure Laterality Date  . ABDOMINAL HYSTERECTOMY    . FLEXIBLE SIGMOIDOSCOPY    . left breast biopsy    . TUBAL LIGATION      FAMILY HISTORY: Family History  Problem Relation Age of Onset  . Cancer Mother        questionable   . Hypertension Unknown   . Heart disease Neg Hx     SOCIAL HISTORY:  Social History   Socioeconomic History  . Marital status: Married    Spouse name: Not on file  . Number of children: 2  . Years of education: 12+  .  Highest education level: Not on file  Occupational History  . Not on file  Social Needs  . Financial resource strain: Not on file  . Food insecurity:    Worry: Not on file    Inability: Not on file  . Transportation needs:    Medical: Not on file    Non-medical: Not on file  Tobacco Use  . Smoking status: Former Smoker    Packs/day: 2.00    Years: 20.00    Pack years: 40.00    Last attempt to quit: 1979    Years since quitting: 40.9  . Smokeless tobacco: Never Used  Substance and Sexual Activity  . Alcohol use: No  . Drug use: No  . Sexual activity: Not Currently  Lifestyle  . Physical activity:    Days per week: Not on file    Minutes per session: Not on file  . Stress: Not on file  Relationships  . Social connections:    Talks on phone: Not on file    Gets together: Not on file    Attends religious service: Not on file    Active member of club or organization: Not on file    Attends meetings of clubs or organizations: Not on file    Relationship status: Not on file  . Intimate partner violence:    Fear of current or ex partner: Not on file    Emotionally abused: Not on file    Physically abused: Not on file    Forced sexual activity: Not on file  Other Topics Concern  . Not on file  Social History Narrative   Married for 44 years    Two boys    Caffeine use: soda, tea sometimes.   Right handed    She worked at a bank for 20 years      PHYSICAL EXAM  Vitals:   07/02/18 1336  BP: 130/88  Pulse: 85  Height: 5' 4.5" (1.638 m)    Body mass index is 23.15 kg/m.   General: The patient is well-developed and well-nourished and in no acute distress  Eyes:  Funduscopic exam shows normal optic discs and retinal vessels.  Neck: The neck is supple, no carotid bruits are noted.  The neck is nontender.  Cardiovascular: The heart has a regular rate and rhythm with a normal S1 and S2. There were no murmurs, gallops or rubs.    Skin: She has no rashes.  She has 2+ pedal edema.  Musculoskeletal: The lower back is nontender  Neurologic Exam  Mental status: The patient is alert and oriented x 3 at the time of the examination.  She appeared to have reduced focus and attention.  Speech is normal.  Cranial nerves: Extraocular movements are full. Pupils are equal, round, and reactive to light and accomodation.  Color vision was normal and symmetric.  Facial symmetry is present. There is good facial sensation to soft touch bilaterally.Facial strength is normal.  Trapezius and sternocleidomastoid strength is normal. No dysarthria is noted.  The tongue is midline, and the patient has symmetric elevation of the soft palate. No obvious hearing deficits are noted.  Motor:  Muscle bulk is normal.  Muscle tone is markedly increased in both legs.  Strength was 3/5 in the legs, 4++ in the right arm and 5 in the left arm.  Rapid alternating movements were performed slower in the right hand than the left hand  Sensory: Sensory testing is intact to pinprick, soft touch and vibration sensation in  the arms.  Touch and temperature was symmetric in the legs but she had reduced vibration sensation in the right leg  Coordination: There is slightly reduced right finger-nose-finger and she cannot do heel-to-shin.  Gait and station: She needs  bilateral support to stand and can only take a couple steps in place with strong bilateral support.   Reflexes: Deep tendon reflexes are increased in the legs where there are crossed abductors at the knees and nonsustained clonus at the ankles.  Reflexes are top normal in the arms and symmetric.  Plantar responses are extensor in the feet.       DIAGNOSTIC DATA (LABS, IMAGING, TESTING) - I reviewed patient records, labs, notes, testing and imaging myself where available.  Lab Results  Component Value Date   WBC 7.2 06/26/2018   HGB 11.1 (L) 06/26/2018   HCT 36.4 06/26/2018   MCV 82.0 06/26/2018   PLT 241 06/26/2018      Component Value Date/Time   NA 141 06/27/2018 0438   K 3.6 06/27/2018 0438   CL 111 06/27/2018 0438   CO2 23 06/27/2018 0438   GLUCOSE 84 06/27/2018 0438   BUN 14 06/27/2018 0438   CREATININE 0.68 06/27/2018 0438   CALCIUM 8.4 (L) 06/27/2018 0438   PROT 7.1 05/17/2016 1348   ALBUMIN 3.7 05/17/2016 1348   AST 12 05/17/2016 1348   ALT 7 05/17/2016 1348   ALKPHOS 67 05/17/2016 1348   BILITOT 1.1 05/17/2016 1348   GFRNONAA >60 06/27/2018 0438   GFRAA >60 06/27/2018 0438   Lab Results  Component Value Date   CHOL 152 06/27/2018   HDL 73 06/27/2018   LDLCALC 63 06/27/2018   LDLDIRECT 95.4 04/04/2012   TRIG 81 06/27/2018   CHOLHDL 2.1 06/27/2018   Lab Results  Component Value Date   HGBA1C 5.3 06/27/2018   Lab Results  Component Value Date   VITAMINB12 211 05/17/2016   Lab Results  Component Value Date   TSH 1.05 05/17/2016       ASSESSMENT AND PLAN  SCLEROSIS, MULTIPLE - Plan: VITAMIN D 25 Hydroxy (Vit-D Deficiency, Fractures), CBC with Differential/Platelet, Vitamin B12, MR CERVICAL SPINE WO CONTRAST  Weakness  Cognitive impairment - Plan: Vitamin B12  Urinary incontinence, unspecified type  Gait disturbance - Plan: VITAMIN D 25 Hydroxy (Vit-D Deficiency, Fractures), CBC with Differential/Platelet, Vitamin B12  Low vitamin D level -  Plan: VITAMIN D 25 Hydroxy (Vit-D Deficiency, Fractures)  Low serum vitamin B12 - Plan: Vitamin B12  Numbness - Plan: Vitamin B12   In summary, Allison Bailey is a 72 year old woman with a long history of multiple sclerosis who has had progressive gait impairments over the last 20 years.  Her MS was brought back to attention with a recent urinary tract infection causing further temporary weakness and she is close to her pre-infection baseline.  She has severe spasticity in her legs.  We had a discussion about disease modifying therapies.  I am uncertain how much benefit she would obtain from a DMT as it has been only mild changes on the MRI over many years and I am not certain that the possible minimal but benefits would justify risk.  I will reconsider this if she shows any exacerbations or more rapid decline.  She has severe spasticity and I will place her on baclofen and titrate her up to 10 mg 3 times daily.  She has a low-dose tizanidine that she takes as needed and I believe the dose is too small to really  make much of a difference.  Additionally I will have her start oxybutynin for her bladder issues.  We also need to rule out other processes that may be worsening her gait.  I will check an MRI of the cervical spine to make sure that there is not a myelopathy that may need to be treated with surgery.  We will check vitamin D and vitamin B12.  She had a low vitamin B12 in the past.  She will return to see me in 3 months or sooner if there are new or worsening neurologic symptoms.  Thank you for asking me to see Allison Bailey.  Please let me know if I can be of further assistance with her or other patients in the future.   Yanisa Goodgame A. Epimenio Foot, MD, PhD, FAAN Certified in Neurology, Clinical Neurophysiology, Sleep Medicine, Pain Medicine and Neuroimaging Director, Multiple Sclerosis Center at [facility]  [facility] [address] [place], [place]  [address] [phone]

## 2018-07-02 NOTE — Telephone Encounter (Signed)
Copied from CRM [id number]. Topic: Quick Communication - Home Health Verbal Orders >> Jul 02, 2018  9:41 AM Angela Nevin wrote: Caller/Agency: [facility] Callback Number: [phone] Requesting PT- 2w3 for functional mobility and fall risk reduction  Requesting order for home health aide- bathing and dressing 2w3   Also requesting referral for occupational therapy ADL.  Recommending hospital bed and wanting to note following medications missing from home amLODipine (NORVASC) 5 MG tablet aspirin EC 81 MG tablet cephALEXin (KEFLEX) 500 MG capsule atorvastatin (LIPITOR) 10 MG tablet

## 2018-07-02 NOTE — Telephone Encounter (Signed)
Advise on entire message

## 2018-07-02 NOTE — Telephone Encounter (Signed)
[facility] Medicare order sent to [facility] pt husband is aware. If he has not heard from them in the next 2-3 business days to give them a call at [phone].

## 2018-07-02 NOTE — Telephone Encounter (Signed)
She needs a hosp f/u visit so we can at least have a face to face for these requested services. TY.

## 2018-07-02 NOTE — Telephone Encounter (Signed)
Pt is scheduled to come in for hospital follow up on Thursday 07/05/18 at 11:15 w/PCP.

## 2018-07-02 NOTE — Telephone Encounter (Signed)
Called [facility] left detailed message of PCP instructions. ° °

## 2018-07-03 ENCOUNTER — Telehealth: Payer: Self-pay | Admitting: *Deleted

## 2018-07-03 DIAGNOSIS — Z9181 History of falling: Secondary | ICD-10-CM | POA: Diagnosis not present

## 2018-07-03 DIAGNOSIS — I69331 Monoplegia of upper limb following cerebral infarction affecting right dominant side: Secondary | ICD-10-CM | POA: Diagnosis not present

## 2018-07-03 DIAGNOSIS — I69344 Monoplegia of lower limb following cerebral infarction affecting left non-dominant side: Secondary | ICD-10-CM | POA: Diagnosis not present

## 2018-07-03 DIAGNOSIS — E785 Hyperlipidemia, unspecified: Secondary | ICD-10-CM | POA: Diagnosis not present

## 2018-07-03 DIAGNOSIS — I10 Essential (primary) hypertension: Secondary | ICD-10-CM | POA: Diagnosis not present

## 2018-07-03 DIAGNOSIS — Z87891 Personal history of nicotine dependence: Secondary | ICD-10-CM | POA: Diagnosis not present

## 2018-07-03 DIAGNOSIS — G35 Multiple sclerosis: Secondary | ICD-10-CM | POA: Diagnosis not present

## 2018-07-03 LAB — CBC WITH DIFFERENTIAL/PLATELET
BASOS: 1 %
Basophils Absolute: 0 10*3/uL (ref 0.0–0.2)
EOS (ABSOLUTE): 0.1 10*3/uL (ref 0.0–0.4)
EOS: 1 %
HEMATOCRIT: 38.8 % (ref 34.0–46.6)
Hemoglobin: 12.6 g/dL (ref 11.1–15.9)
Immature Grans (Abs): 0 10*3/uL (ref 0.0–0.1)
Immature Granulocytes: 0 %
LYMPHS ABS: 1.1 10*3/uL (ref 0.7–3.1)
Lymphs: 17 %
MCH: 25.8 pg — AB (ref 26.6–33.0)
MCHC: 32.5 g/dL (ref 31.5–35.7)
MCV: 79 fL (ref 79–97)
MONOS ABS: 0.6 10*3/uL (ref 0.1–0.9)
Monocytes: 9 %
Neutrophils Absolute: 4.7 10*3/uL (ref 1.4–7.0)
Neutrophils: 72 %
Platelets: 355 10*3/uL (ref 150–450)
RBC: 4.89 x10E6/uL (ref 3.77–5.28)
RDW: 14 % (ref 12.3–15.4)
WBC: 6.6 10*3/uL (ref 3.4–10.8)

## 2018-07-03 LAB — VITAMIN D 25 HYDROXY (VIT D DEFICIENCY, FRACTURES): Vit D, 25-Hydroxy: <4 ng/mL — ABNORMAL LOW (ref 30.0–100.0)

## 2018-07-03 LAB — VITAMIN B12: Vitamin B-12: 299 pg/mL (ref 232–1245)

## 2018-07-03 MED ORDER — VITAMIN D (ERGOCALCIFEROL) 1.25 MG (50000 UNIT) PO CAPS: 50000.0000 [IU] | ORAL_CAPSULE | ORAL | 3 refills | Status: DC

## 2018-07-03 NOTE — Telephone Encounter (Signed)
Tried calling home number. Went to automated message stating no room to leave VM. Will try again later.

## 2018-07-03 NOTE — Telephone Encounter (Signed)
I called and spoke with husband (on [place]) about lab results. He is aware she should start Vit D. Supplement 50,000U once weekly. Verified pharmacy on file. Advised we will send prescription in for her. He verbalized understanding.

## 2018-07-03 NOTE — Telephone Encounter (Signed)
Faxed signed order back to [facility] re: hospital bed semi electric, decubitus care pad (gel), trapeze bar, bed-attached. Fax: [phone]. Received fax confirmation.  "Pt has severe spasticity and spasms secondary to MS which requires all extremities to be positioned in ways not feasible  With a normal bed. Head must be elevated 30-45 degrees or risk of choking episodes increases. MS, spasticity, spasms requires frequent and immediate changes in body position which cannot be achieved with a normal bed. "

## 2018-07-03 NOTE — Telephone Encounter (Signed)
-----   Message from Asa Lente, MD sent at 07/03/2018  2:50 PM EST ----- Please let him know that the vitamin D was very low.  She should take 50,000 units weekly.  Please call in #12  #3 refill

## 2018-07-04 ENCOUNTER — Telehealth: Payer: Self-pay | Admitting: Family Medicine

## 2018-07-04 NOTE — Telephone Encounter (Signed)
Copied from CRM [id number]. Topic: Quick Communication - Home Health Verbal Orders >> Jul 04, 2018 10:11 AM Marylen Ponto wrote: Caller/Agency: [facility] Callback Number: [phone] Requesting OT/PT/Skilled Nursing/Social Work: Physical Therapy  Frequency: PT 2 times a week for 3 weeks, Social work 1 time a week for 1 week , and OT evaluation

## 2018-07-04 NOTE — Telephone Encounter (Signed)
Called [facility] informed of PCP verbal ok 

## 2018-07-05 ENCOUNTER — Ambulatory Visit (INDEPENDENT_AMBULATORY_CARE_PROVIDER_SITE_OTHER): Payer: Medicare Other | Admitting: Family Medicine

## 2018-07-05 ENCOUNTER — Encounter: Payer: Self-pay | Admitting: Family Medicine

## 2018-07-05 VITALS — BP 122/82 | HR 69 | Temp 98.2°F

## 2018-07-05 DIAGNOSIS — R7989 Other specified abnormal findings of blood chemistry: Secondary | ICD-10-CM | POA: Diagnosis not present

## 2018-07-05 DIAGNOSIS — N390 Urinary tract infection, site not specified: Secondary | ICD-10-CM | POA: Diagnosis not present

## 2018-07-05 DIAGNOSIS — G35 Multiple sclerosis: Secondary | ICD-10-CM | POA: Diagnosis not present

## 2018-07-05 NOTE — Progress Notes (Signed)
Pre visit review using our clinic review tool, if applicable. No additional management support is needed unless otherwise documented below in the visit note. 

## 2018-07-05 NOTE — Progress Notes (Signed)
Chief Complaint  Patient presents with  . Hospitalization Follow-up    HPI Allison Bailey is a 72 y.o. y.o. female who presents for a transition of care visit.  Pt was discharged from [facility] on 06/29/18.  Within 48 business hours of discharge our office contacted her via telephone.  Patient was admitted to [facility] on 06/26/2018 and discharged on 06/29/2018.  She presented with worsening weakness and suspected urinary tract infection.  She is given antibiotics.  MRI was obtained to evaluate for new lesions in the setting of a CT scan without acute changes.  She saw her neurologist 3 days ago.  MRI of C-spine scheduled in early December.  Patient feels fine now.  No current complaints.  She has finished her antibiotic.  She is unsure if she is taking baclofen or Zanaflex.  She is here with her son who does help a little bit with a history.   Past Medical History:  Diagnosis Date  . Hyperlipidemia   . Hypertension   . MS (multiple sclerosis) (HCC)   . Stroke (HCC)   . Tuberculosis    Family History  Problem Relation Age of Onset  . Cancer Mother        questionable   . Hypertension Unknown   . Heart disease Neg Hx    Allergies as of 07/05/2018   No Known Allergies     Medication List        Accurate as of 07/05/18 11:48 AM. Always use your most recent med list.          acetaminophen 500 MG tablet Commonly known as:  TYLENOL Take 1,000 mg by mouth every 6 (six) hours as needed for mild pain.   amLODipine 5 MG tablet Commonly known as:  NORVASC Take 1 tablet (5 mg total) by mouth daily.   aspirin EC 81 MG tablet Take 1 tablet (81 mg total) by mouth daily.   atorvastatin 10 MG tablet Commonly known as:  LIPITOR Take 1 tablet (10 mg total) by mouth daily at 6 PM.   baclofen 10 MG tablet Commonly known as:  LIORESAL Take 1/2 to 1 pill po tid   oxybutynin 5 MG tablet Commonly known as:  DITROPAN Take 1 tablet (5 mg total) by mouth 2 (two) times daily.     Vitamin D (Ergocalciferol) 1.25 MG (50000 UT) Caps capsule Commonly known as:  DRISDOL Take 1 capsule (50,000 Units total) by mouth every 7 (seven) days.       ROS:  Constitutional: No fevers or chills, no weight loss HEENT: No headaches, hearing loss, or runny nose, no sore throat Heart: No chest pain Lungs: No SOB Abd: No bowel changes,  GU: No urinary complaints Neuro: no new s/s's Msk: No new joint or muscle pain  Objective BP 122/82 (BP Location: Left Arm, Patient Position: Sitting, Cuff Size: Normal)   Pulse 69   Temp 98.2 F (36.8 C) (Oral)   SpO2 99%  General Appearance:  awake, alert, oriented, in no acute distress and well developed, well nourished Skin:  there are no suspicious lesions or rashes of concern Head/face:  NCAT Eyes:  EOMI, PERRLA Ears:  canals and TMs NI Nose/Sinuses:  negative Mouth/Throat:  Mucosa moist, no lesions; pharynx without erythema, edema or exudate. Neck:  neck- supple, no mass, non-tender and no jvd Lungs: Clear to auscultation.  No rales, rhonchi, or wheezing. Normal effort, no accessory muscle use. Heart:  Heart sounds are normal.  Regular rate and rhythm  Abdomen:  BS+, soft, NT, ND, no masses or organomegaly Musculoskeletal:  No muscle group atrophy or asymmetry Neurologic:  Alert and oriented x 3, gait normal.,  She is not ambulatory Psych exam: Nml mood and affect, age appropriate judgment and insight  Low vitamin D level  Multiple sclerosis (HCC)  Acute lower UTI  Discharge summary and medication list have been reviewed/reconciled.  Labs pending at the time of discharge have been reviewed or are still pending at the time of this visit.  Follow-up labs and appointments have been ordered and/or coordinated appropriately. Educational materials regarding the patient's admitting diagnosis provided.  TRANSITIONAL CARE MANAGEMENT CERTIFICATION:  I certify the following are true:   1. Communication with the patient/care giver was  made within 2 business days of discharge.  2. Complexity of Medical decision making is moderate.  3. Face to face visit occurred within 14 days of discharge.   She has follow-up with Dr. Epimenio Foot in March but has seen him since discharge.  Take 2000 units daily in addition to weekly prescription dose. F/u as originally scheduled. The patient and her son voiced understanding and agreement to the plan.  Jilda Roche [place], DO 07/05/18 11:48 AM

## 2018-07-05 NOTE — Patient Instructions (Addendum)
You should be done with Keflex (antibiotic for UTI).  Stop taking Zanaflex since you are taking baclofen.  Take 2000 units of Vit D daily in addition to the weekly dose.

## 2018-07-06 ENCOUNTER — Telehealth: Payer: Self-pay | Admitting: Family Medicine

## 2018-07-06 DIAGNOSIS — I69344 Monoplegia of lower limb following cerebral infarction affecting left non-dominant side: Secondary | ICD-10-CM | POA: Diagnosis not present

## 2018-07-06 DIAGNOSIS — Z9181 History of falling: Secondary | ICD-10-CM | POA: Diagnosis not present

## 2018-07-06 DIAGNOSIS — I10 Essential (primary) hypertension: Secondary | ICD-10-CM | POA: Diagnosis not present

## 2018-07-06 DIAGNOSIS — G35 Multiple sclerosis: Secondary | ICD-10-CM | POA: Diagnosis not present

## 2018-07-06 DIAGNOSIS — E785 Hyperlipidemia, unspecified: Secondary | ICD-10-CM | POA: Diagnosis not present

## 2018-07-06 DIAGNOSIS — I69331 Monoplegia of upper limb following cerebral infarction affecting right dominant side: Secondary | ICD-10-CM | POA: Diagnosis not present

## 2018-07-06 DIAGNOSIS — Z87891 Personal history of nicotine dependence: Secondary | ICD-10-CM | POA: Diagnosis not present

## 2018-07-06 NOTE — Telephone Encounter (Signed)
Called [facility] left detailed message of PCP verbal ok 

## 2018-07-06 NOTE — Telephone Encounter (Signed)
Copied from CRM [id number]. Topic: Quick Communication - See Telephone Encounter >> Jul 06, 2018  2:39 PM Herby Abraham C wrote: CRM for notification. See Telephone encounter for: 07/06/18.  [place] with [facility] calling to request vo for OT.   Frequency: 1 time for 2 weeks and 2 time a week for 2 weeks.   CB: [phone]

## 2018-07-09 ENCOUNTER — Telehealth: Payer: Self-pay | Admitting: *Deleted

## 2018-07-09 DIAGNOSIS — G35 Multiple sclerosis: Secondary | ICD-10-CM | POA: Diagnosis not present

## 2018-07-09 DIAGNOSIS — I639 Cerebral infarction, unspecified: Secondary | ICD-10-CM | POA: Diagnosis not present

## 2018-07-09 DIAGNOSIS — R531 Weakness: Secondary | ICD-10-CM | POA: Diagnosis not present

## 2018-07-09 NOTE — Telephone Encounter (Signed)
Received Home Health Certification and Plan of Care; forwarded to provider/SLS 11/25

## 2018-07-10 ENCOUNTER — Telehealth: Payer: Self-pay | Admitting: *Deleted

## 2018-07-10 NOTE — Telephone Encounter (Signed)
Received Physician Orders from [facility]; forwarded to provider/SLS 11/26  

## 2018-07-11 DIAGNOSIS — I69344 Monoplegia of lower limb following cerebral infarction affecting left non-dominant side: Secondary | ICD-10-CM | POA: Diagnosis not present

## 2018-07-11 DIAGNOSIS — I69331 Monoplegia of upper limb following cerebral infarction affecting right dominant side: Secondary | ICD-10-CM | POA: Diagnosis not present

## 2018-07-11 DIAGNOSIS — Z9181 History of falling: Secondary | ICD-10-CM | POA: Diagnosis not present

## 2018-07-11 DIAGNOSIS — G35 Multiple sclerosis: Secondary | ICD-10-CM | POA: Diagnosis not present

## 2018-07-11 DIAGNOSIS — I10 Essential (primary) hypertension: Secondary | ICD-10-CM | POA: Diagnosis not present

## 2018-07-11 DIAGNOSIS — Z87891 Personal history of nicotine dependence: Secondary | ICD-10-CM | POA: Diagnosis not present

## 2018-07-11 DIAGNOSIS — E785 Hyperlipidemia, unspecified: Secondary | ICD-10-CM | POA: Diagnosis not present

## 2018-07-13 DIAGNOSIS — I69331 Monoplegia of upper limb following cerebral infarction affecting right dominant side: Secondary | ICD-10-CM | POA: Diagnosis not present

## 2018-07-13 DIAGNOSIS — Z87891 Personal history of nicotine dependence: Secondary | ICD-10-CM | POA: Diagnosis not present

## 2018-07-13 DIAGNOSIS — E785 Hyperlipidemia, unspecified: Secondary | ICD-10-CM | POA: Diagnosis not present

## 2018-07-13 DIAGNOSIS — I69344 Monoplegia of lower limb following cerebral infarction affecting left non-dominant side: Secondary | ICD-10-CM | POA: Diagnosis not present

## 2018-07-13 DIAGNOSIS — G35 Multiple sclerosis: Secondary | ICD-10-CM | POA: Diagnosis not present

## 2018-07-13 DIAGNOSIS — I10 Essential (primary) hypertension: Secondary | ICD-10-CM | POA: Diagnosis not present

## 2018-07-13 DIAGNOSIS — Z9181 History of falling: Secondary | ICD-10-CM | POA: Diagnosis not present

## 2018-07-16 ENCOUNTER — Telehealth: Payer: Self-pay | Admitting: Family Medicine

## 2018-07-16 DIAGNOSIS — Z87891 Personal history of nicotine dependence: Secondary | ICD-10-CM | POA: Diagnosis not present

## 2018-07-16 DIAGNOSIS — G35 Multiple sclerosis: Secondary | ICD-10-CM | POA: Diagnosis not present

## 2018-07-16 DIAGNOSIS — E785 Hyperlipidemia, unspecified: Secondary | ICD-10-CM | POA: Diagnosis not present

## 2018-07-16 DIAGNOSIS — I69331 Monoplegia of upper limb following cerebral infarction affecting right dominant side: Secondary | ICD-10-CM | POA: Diagnosis not present

## 2018-07-16 DIAGNOSIS — Z9181 History of falling: Secondary | ICD-10-CM | POA: Diagnosis not present

## 2018-07-16 DIAGNOSIS — I69344 Monoplegia of lower limb following cerebral infarction affecting left non-dominant side: Secondary | ICD-10-CM | POA: Diagnosis not present

## 2018-07-16 DIAGNOSIS — I10 Essential (primary) hypertension: Secondary | ICD-10-CM | POA: Diagnosis not present

## 2018-07-16 NOTE — Telephone Encounter (Signed)
Called [facility] informed of verbal PCP ok (left detailed message)

## 2018-07-16 NOTE — Telephone Encounter (Signed)
Copied from CRM [id number]. Topic: Quick Communication - Home Health Verbal Orders >> Jul 16, 2018  4:19 PM Jaquita Rector A wrote: Caller/Agency: [place] / [facility] Callback Number: [phone] ok to LM  Requesting OT/PT/Skilled Nursing/Social Work: Home Health Aide Frequency: 2xs week for 2 weeks

## 2018-07-17 DIAGNOSIS — G35 Multiple sclerosis: Secondary | ICD-10-CM | POA: Diagnosis not present

## 2018-07-17 DIAGNOSIS — Z87891 Personal history of nicotine dependence: Secondary | ICD-10-CM | POA: Diagnosis not present

## 2018-07-17 DIAGNOSIS — I10 Essential (primary) hypertension: Secondary | ICD-10-CM | POA: Diagnosis not present

## 2018-07-17 DIAGNOSIS — Z9181 History of falling: Secondary | ICD-10-CM | POA: Diagnosis not present

## 2018-07-17 DIAGNOSIS — I69331 Monoplegia of upper limb following cerebral infarction affecting right dominant side: Secondary | ICD-10-CM | POA: Diagnosis not present

## 2018-07-17 DIAGNOSIS — E785 Hyperlipidemia, unspecified: Secondary | ICD-10-CM | POA: Diagnosis not present

## 2018-07-17 DIAGNOSIS — I69344 Monoplegia of lower limb following cerebral infarction affecting left non-dominant side: Secondary | ICD-10-CM | POA: Diagnosis not present

## 2018-07-18 ENCOUNTER — Telehealth: Payer: Self-pay | Admitting: *Deleted

## 2018-07-18 DIAGNOSIS — I10 Essential (primary) hypertension: Secondary | ICD-10-CM | POA: Diagnosis not present

## 2018-07-18 DIAGNOSIS — Z9181 History of falling: Secondary | ICD-10-CM | POA: Diagnosis not present

## 2018-07-18 DIAGNOSIS — E785 Hyperlipidemia, unspecified: Secondary | ICD-10-CM | POA: Diagnosis not present

## 2018-07-18 DIAGNOSIS — G35 Multiple sclerosis: Secondary | ICD-10-CM | POA: Diagnosis not present

## 2018-07-18 DIAGNOSIS — I69331 Monoplegia of upper limb following cerebral infarction affecting right dominant side: Secondary | ICD-10-CM | POA: Diagnosis not present

## 2018-07-18 DIAGNOSIS — Z87891 Personal history of nicotine dependence: Secondary | ICD-10-CM | POA: Diagnosis not present

## 2018-07-18 DIAGNOSIS — I69344 Monoplegia of lower limb following cerebral infarction affecting left non-dominant side: Secondary | ICD-10-CM | POA: Diagnosis not present

## 2018-07-18 NOTE — Telephone Encounter (Signed)
Received Physician Orders from [facility]; forwarded to provider/SLS 12/04

## 2018-07-19 ENCOUNTER — Ambulatory Visit
Admission: RE | Admit: 2018-07-19 | Discharge: 2018-07-19 | Disposition: A | Discharge status: Home / Self Care | Payer: Medicare Other | Source: Ambulatory Visit | Attending: Neurology | Admitting: Neurology

## 2018-07-19 DIAGNOSIS — G35 Multiple sclerosis: Secondary | ICD-10-CM

## 2018-07-20 DIAGNOSIS — G35 Multiple sclerosis: Secondary | ICD-10-CM | POA: Diagnosis not present

## 2018-07-20 DIAGNOSIS — I69344 Monoplegia of lower limb following cerebral infarction affecting left non-dominant side: Secondary | ICD-10-CM | POA: Diagnosis not present

## 2018-07-20 DIAGNOSIS — I69331 Monoplegia of upper limb following cerebral infarction affecting right dominant side: Secondary | ICD-10-CM | POA: Diagnosis not present

## 2018-07-20 DIAGNOSIS — E785 Hyperlipidemia, unspecified: Secondary | ICD-10-CM | POA: Diagnosis not present

## 2018-07-20 DIAGNOSIS — I10 Essential (primary) hypertension: Secondary | ICD-10-CM | POA: Diagnosis not present

## 2018-07-20 DIAGNOSIS — Z9181 History of falling: Secondary | ICD-10-CM | POA: Diagnosis not present

## 2018-07-20 DIAGNOSIS — Z87891 Personal history of nicotine dependence: Secondary | ICD-10-CM | POA: Diagnosis not present

## 2018-07-23 ENCOUNTER — Telehealth: Payer: Self-pay | Admitting: *Deleted

## 2018-07-23 DIAGNOSIS — Z87891 Personal history of nicotine dependence: Secondary | ICD-10-CM | POA: Diagnosis not present

## 2018-07-23 DIAGNOSIS — I10 Essential (primary) hypertension: Secondary | ICD-10-CM | POA: Diagnosis not present

## 2018-07-23 DIAGNOSIS — I69344 Monoplegia of lower limb following cerebral infarction affecting left non-dominant side: Secondary | ICD-10-CM | POA: Diagnosis not present

## 2018-07-23 DIAGNOSIS — Z9181 History of falling: Secondary | ICD-10-CM | POA: Diagnosis not present

## 2018-07-23 DIAGNOSIS — G35 Multiple sclerosis: Secondary | ICD-10-CM | POA: Diagnosis not present

## 2018-07-23 DIAGNOSIS — E785 Hyperlipidemia, unspecified: Secondary | ICD-10-CM | POA: Diagnosis not present

## 2018-07-23 DIAGNOSIS — I69331 Monoplegia of upper limb following cerebral infarction affecting right dominant side: Secondary | ICD-10-CM | POA: Diagnosis not present

## 2018-07-23 NOTE — Telephone Encounter (Signed)
-----   Message from Hillis Range, RN sent at 07/23/2018  7:37 AM EST -----   ----- Message ----- From: Asa Lente, MD Sent: 07/22/2018   5:01 PM EST To: Hillis Range, RN  Please let the patient know that the MRI shows a fe old MS plaques in the spinal cord.    She has arthritic changes in the spine, worse at [facility] and C5C6 but no nerve root compression

## 2018-07-23 NOTE — Telephone Encounter (Signed)
I spoke with pt's husband, Fayrene Fearing (on [place]) and reviewed below MRI results. He verbalized understanding of same and will give message to pt/fim

## 2018-07-24 ENCOUNTER — Telehealth: Payer: Self-pay | Admitting: Family Medicine

## 2018-07-24 NOTE — Telephone Encounter (Signed)
Copied from CRM [id number]. Topic: Quick Communication - Home Health Verbal Orders >> Jul 24, 2018 12:50 PM Fanny Bien wrote: Caller/Agency: [facility]ittany/[facility]  Callback [phone] Requesting extent for OT  Frequency: 2x1 1x1

## 2018-07-24 NOTE — Telephone Encounter (Signed)
Verbal orders given  

## 2018-07-25 DIAGNOSIS — G35 Multiple sclerosis: Secondary | ICD-10-CM | POA: Diagnosis not present

## 2018-07-25 DIAGNOSIS — E785 Hyperlipidemia, unspecified: Secondary | ICD-10-CM | POA: Diagnosis not present

## 2018-07-25 DIAGNOSIS — Z9181 History of falling: Secondary | ICD-10-CM | POA: Diagnosis not present

## 2018-07-25 DIAGNOSIS — Z87891 Personal history of nicotine dependence: Secondary | ICD-10-CM | POA: Diagnosis not present

## 2018-07-25 DIAGNOSIS — I10 Essential (primary) hypertension: Secondary | ICD-10-CM | POA: Diagnosis not present

## 2018-07-25 DIAGNOSIS — I69344 Monoplegia of lower limb following cerebral infarction affecting left non-dominant side: Secondary | ICD-10-CM | POA: Diagnosis not present

## 2018-07-25 DIAGNOSIS — I69331 Monoplegia of upper limb following cerebral infarction affecting right dominant side: Secondary | ICD-10-CM | POA: Diagnosis not present

## 2018-07-27 DIAGNOSIS — I10 Essential (primary) hypertension: Secondary | ICD-10-CM | POA: Diagnosis not present

## 2018-07-27 DIAGNOSIS — Z87891 Personal history of nicotine dependence: Secondary | ICD-10-CM | POA: Diagnosis not present

## 2018-07-27 DIAGNOSIS — I69344 Monoplegia of lower limb following cerebral infarction affecting left non-dominant side: Secondary | ICD-10-CM | POA: Diagnosis not present

## 2018-07-27 DIAGNOSIS — G35 Multiple sclerosis: Secondary | ICD-10-CM | POA: Diagnosis not present

## 2018-07-27 DIAGNOSIS — I69331 Monoplegia of upper limb following cerebral infarction affecting right dominant side: Secondary | ICD-10-CM | POA: Diagnosis not present

## 2018-07-27 DIAGNOSIS — E785 Hyperlipidemia, unspecified: Secondary | ICD-10-CM | POA: Diagnosis not present

## 2018-07-27 DIAGNOSIS — Z9181 History of falling: Secondary | ICD-10-CM | POA: Diagnosis not present

## 2018-07-31 DIAGNOSIS — I69331 Monoplegia of upper limb following cerebral infarction affecting right dominant side: Secondary | ICD-10-CM | POA: Diagnosis not present

## 2018-07-31 DIAGNOSIS — I69344 Monoplegia of lower limb following cerebral infarction affecting left non-dominant side: Secondary | ICD-10-CM | POA: Diagnosis not present

## 2018-07-31 DIAGNOSIS — I10 Essential (primary) hypertension: Secondary | ICD-10-CM | POA: Diagnosis not present

## 2018-07-31 DIAGNOSIS — E785 Hyperlipidemia, unspecified: Secondary | ICD-10-CM | POA: Diagnosis not present

## 2018-07-31 DIAGNOSIS — Z9181 History of falling: Secondary | ICD-10-CM | POA: Diagnosis not present

## 2018-07-31 DIAGNOSIS — G35 Multiple sclerosis: Secondary | ICD-10-CM | POA: Diagnosis not present

## 2018-07-31 DIAGNOSIS — Z87891 Personal history of nicotine dependence: Secondary | ICD-10-CM | POA: Diagnosis not present

## 2018-08-02 DIAGNOSIS — E785 Hyperlipidemia, unspecified: Secondary | ICD-10-CM | POA: Diagnosis not present

## 2018-08-02 DIAGNOSIS — I69344 Monoplegia of lower limb following cerebral infarction affecting left non-dominant side: Secondary | ICD-10-CM | POA: Diagnosis not present

## 2018-08-02 DIAGNOSIS — I10 Essential (primary) hypertension: Secondary | ICD-10-CM | POA: Diagnosis not present

## 2018-08-02 DIAGNOSIS — I69331 Monoplegia of upper limb following cerebral infarction affecting right dominant side: Secondary | ICD-10-CM | POA: Diagnosis not present

## 2018-08-02 DIAGNOSIS — G35 Multiple sclerosis: Secondary | ICD-10-CM | POA: Diagnosis not present

## 2018-08-02 DIAGNOSIS — Z9181 History of falling: Secondary | ICD-10-CM | POA: Diagnosis not present

## 2018-08-02 DIAGNOSIS — Z87891 Personal history of nicotine dependence: Secondary | ICD-10-CM | POA: Diagnosis not present

## 2018-08-05 DIAGNOSIS — G35 Multiple sclerosis: Secondary | ICD-10-CM | POA: Diagnosis not present

## 2018-08-06 DIAGNOSIS — E785 Hyperlipidemia, unspecified: Secondary | ICD-10-CM | POA: Diagnosis not present

## 2018-08-06 DIAGNOSIS — I69331 Monoplegia of upper limb following cerebral infarction affecting right dominant side: Secondary | ICD-10-CM | POA: Diagnosis not present

## 2018-08-06 DIAGNOSIS — Z87891 Personal history of nicotine dependence: Secondary | ICD-10-CM | POA: Diagnosis not present

## 2018-08-06 DIAGNOSIS — I69344 Monoplegia of lower limb following cerebral infarction affecting left non-dominant side: Secondary | ICD-10-CM | POA: Diagnosis not present

## 2018-08-06 DIAGNOSIS — G35 Multiple sclerosis: Secondary | ICD-10-CM | POA: Diagnosis not present

## 2018-08-06 DIAGNOSIS — I10 Essential (primary) hypertension: Secondary | ICD-10-CM | POA: Diagnosis not present

## 2018-08-06 DIAGNOSIS — Z9181 History of falling: Secondary | ICD-10-CM | POA: Diagnosis not present

## 2018-08-08 DIAGNOSIS — G35 Multiple sclerosis: Secondary | ICD-10-CM | POA: Diagnosis not present

## 2018-08-09 ENCOUNTER — Telehealth: Payer: Self-pay | Admitting: *Deleted

## 2018-08-09 NOTE — Telephone Encounter (Signed)
Received Physician Orders from [facility]; forwarded to provider/SLS 12/26  

## 2018-08-10 ENCOUNTER — Telehealth: Payer: Self-pay | Admitting: *Deleted

## 2018-08-10 NOTE — Telephone Encounter (Signed)
Received Home Health Discharge from [facility]; forwarded to provider/SLS 12/27  

## 2018-09-08 DIAGNOSIS — G35 Multiple sclerosis: Secondary | ICD-10-CM | POA: Diagnosis not present

## 2018-09-27 ENCOUNTER — Other Ambulatory Visit: Payer: Self-pay | Admitting: Family Medicine

## 2018-09-27 DIAGNOSIS — G35 Multiple sclerosis: Secondary | ICD-10-CM

## 2018-10-09 DIAGNOSIS — G35 Multiple sclerosis: Secondary | ICD-10-CM | POA: Diagnosis not present

## 2018-10-29 ENCOUNTER — Ambulatory Visit: Payer: Medicare Other | Admitting: Family Medicine

## 2018-10-29 ENCOUNTER — Ambulatory Visit (INDEPENDENT_AMBULATORY_CARE_PROVIDER_SITE_OTHER): Payer: Medicare Other | Admitting: Family Medicine

## 2018-10-29 ENCOUNTER — Other Ambulatory Visit: Payer: Self-pay

## 2018-10-29 ENCOUNTER — Encounter: Payer: Self-pay | Admitting: Family Medicine

## 2018-10-29 VITALS — BP 120/82 | HR 77 | Temp 98.2°F

## 2018-10-29 DIAGNOSIS — R600 Localized edema: Secondary | ICD-10-CM | POA: Diagnosis not present

## 2018-10-29 DIAGNOSIS — R350 Frequency of micturition: Secondary | ICD-10-CM

## 2018-10-29 DIAGNOSIS — R5383 Other fatigue: Secondary | ICD-10-CM | POA: Diagnosis not present

## 2018-10-29 MED ORDER — FLUCONAZOLE 150 MG PO TABS: 150.0000 mg | ORAL_TABLET | Freq: Once | ORAL | 0 refills | Status: AC

## 2018-10-29 MED ORDER — CEPHALEXIN 500 MG PO CAPS: 500.0000 mg | ORAL_CAPSULE | Freq: Two times a day (BID) | ORAL | 0 refills | Status: AC

## 2018-10-29 NOTE — Addendum Note (Signed)
Addended by: Scharlene Gloss B on: 10/29/2018 03:52 PM   Modules accepted: Orders

## 2018-10-29 NOTE — Progress Notes (Signed)
Chief Complaint  Patient presents with  . Follow-up    sleeping more  . Urinary Frequency    Allison Bailey is a 73 y.o. female here for possible UTI. Here w son  Duration: 2 weeks. Symptoms: urinary frequency, urinary retention and fatigue Denies: hematuria, fever, nausea, vomiting, urinary incontinence, dysuria, flank pain, vaginal discharge Hx of recurrent UTI? No  4 mo ago had hospitalization for UTI amongst other things.  Has been having more fatigue as well.  Diet is poor. +LE edema, worse on L but this is typical for her. No calf pain. She does consume a lot of salt.   ROS:  Constitutional: denies fever GU: As noted in HPI  Past Medical History:  Diagnosis Date  . Hyperlipidemia   . Hypertension   . MS (multiple sclerosis) (HCC)   . Stroke (HCC)   . Tuberculosis     BP 120/82 (BP Location: Left Arm, Patient Position: Sitting, Cuff Size: Normal)   Pulse 77   Temp 98.2 F (36.8 C) (Oral)   SpO2 98%  General: Awake, alert, appears stated age Heart: RRR Lungs: CTAB, normal respiratory effort, no accessory muscle usage Abd: BS+, soft, NT, ND, no masses or organomegaly MSK: No CVA tenderness, neg Lloyd's sign Psych: Nml affect/mood  Urinary frequency - Plan: cephALEXin (KEFLEX) 500 MG capsule, fluconazole (DIFLUCAN) 150 MG tablet, Hemoglobin A1c  Fatigue, unspecified type - Plan: CBC, Comprehensive metabolic panel  Lower extremity edema  Pt states this is the same she presented last time for her UTI. Will empirically treat while awaiting culture results. Stay hydrated, clean up diet. Seek immediate care if pt starts to develop fevers, new/worsening symptoms, uncontrollable N/V. Compression stocking rx provided. Mind salt intake, elevate legs, stay active. Ck labs.  F/u prn. The patient and her son voiced understanding and agreement to the plan.  Jilda Roche [place], DO 10/29/18 3:30 PM

## 2018-10-29 NOTE — Patient Instructions (Signed)
Give [place] 2-3 business days to get the results of your labs back.   Stay hydrated.   Warning signs/symptoms: Uncontrollable nausea/vomiting, fevers, worsening symptoms despite treatment, confusion.  Give [place] around 2 business days to get culture back to you.  Keep the diet clean and stay active.  Elevate the legs and mind the salt intake.  Let us know if you need anything.

## 2018-10-30 LAB — COMPREHENSIVE METABOLIC PANEL
ALBUMIN: 4 g/dL (ref 3.5–5.2)
ALT: 8 U/L (ref 0–35)
AST: 13 U/L (ref 0–37)
Alkaline Phosphatase: 72 U/L (ref 39–117)
BUN: 15 mg/dL (ref 6–23)
CALCIUM: 9.7 mg/dL (ref 8.4–10.5)
CHLORIDE: 104 meq/L (ref 96–112)
CO2: 30 mEq/L (ref 19–32)
Creatinine, Ser: 0.56 mg/dL (ref 0.40–1.20)
GFR: 128.88 mL/min (ref >60.00)
Glucose, Bld: 89 mg/dL (ref 70–99)
POTASSIUM: 3.7 meq/L (ref 3.5–5.1)
SODIUM: 141 meq/L (ref 135–145)
Total Bilirubin: 1.1 mg/dL (ref 0.2–1.2)
Total Protein: 7.4 g/dL (ref 6.0–8.3)

## 2018-10-30 LAB — CBC
HEMATOCRIT: 41.4 % (ref 36.0–46.0)
Hemoglobin: 13.5 g/dL (ref 12.0–15.0)
MCHC: 32.7 g/dL (ref 30.0–36.0)
MCV: 80.8 fl (ref 78.0–100.0)
PLATELETS: 308 10*3/uL (ref 150.0–400.0)
RBC: 5.13 Mil/uL — AB (ref 3.87–5.11)
RDW: 14.6 % (ref 11.5–15.5)
WBC: 4.1 10*3/uL (ref 4.0–10.5)

## 2018-10-30 LAB — HEMOGLOBIN A1C: HEMOGLOBIN A1C: 5.7 % (ref 4.6–6.5)

## 2018-11-01 ENCOUNTER — Other Ambulatory Visit: Payer: Self-pay

## 2018-11-01 ENCOUNTER — Other Ambulatory Visit (INDEPENDENT_AMBULATORY_CARE_PROVIDER_SITE_OTHER): Payer: Medicare Other

## 2018-11-01 DIAGNOSIS — R350 Frequency of micturition: Secondary | ICD-10-CM | POA: Diagnosis not present

## 2018-11-02 ENCOUNTER — Telehealth: Payer: Self-pay | Admitting: *Deleted

## 2018-11-02 LAB — URINALYSIS, ROUTINE W REFLEX MICROSCOPIC
Bilirubin Urine: NEGATIVE
Hgb urine dipstick: NEGATIVE
KETONES UR: NEGATIVE
Leukocytes,Ua: NEGATIVE
Nitrite: NEGATIVE
PH: 5.5 (ref 5.0–8.0)
RBC / HPF: NONE SEEN (ref 0–?)
Total Protein, Urine: NEGATIVE
Urine Glucose: NEGATIVE
Urobilinogen, UA: 0.2 (ref 0.0–1.0)

## 2018-11-02 LAB — URINE CULTURE
MICRO NUMBER: 337189
SPECIMEN QUALITY: ADEQUATE

## 2018-11-02 NOTE — Telephone Encounter (Signed)
Called and spoke with husband, Fayrene Fearing- on [place]. Advised appt on 11/05/18 cx. Offered telephone visit w/ [place] [facility] but he declined. They would like to be called back on Monday to r/s for a later date. Advised I will call them back. I cx 11/05/18 appt.

## 2018-11-05 ENCOUNTER — Ambulatory Visit: Payer: Medicare Other | Admitting: Neurology

## 2018-11-05 NOTE — Telephone Encounter (Signed)
Called, LVM for them to call and r/s apt. Can offer 01/10/19 at 1030am with Dr. Epimenio Foot if they call back

## 2018-11-07 ENCOUNTER — Encounter: Payer: Self-pay | Admitting: Family Medicine

## 2018-11-07 DIAGNOSIS — R531 Weakness: Secondary | ICD-10-CM | POA: Diagnosis not present

## 2018-11-07 DIAGNOSIS — I639 Cerebral infarction, unspecified: Secondary | ICD-10-CM | POA: Diagnosis not present

## 2018-11-07 DIAGNOSIS — G35 Multiple sclerosis: Secondary | ICD-10-CM | POA: Diagnosis not present

## 2018-12-08 DIAGNOSIS — G35 Multiple sclerosis: Secondary | ICD-10-CM | POA: Diagnosis not present

## 2018-12-08 DIAGNOSIS — R531 Weakness: Secondary | ICD-10-CM | POA: Diagnosis not present

## 2018-12-08 DIAGNOSIS — I639 Cerebral infarction, unspecified: Secondary | ICD-10-CM | POA: Diagnosis not present

## 2018-12-21 ENCOUNTER — Other Ambulatory Visit: Payer: Self-pay | Admitting: Neurology

## 2019-01-07 DIAGNOSIS — I639 Cerebral infarction, unspecified: Secondary | ICD-10-CM | POA: Diagnosis not present

## 2019-01-07 DIAGNOSIS — G35 Multiple sclerosis: Secondary | ICD-10-CM | POA: Diagnosis not present

## 2019-01-07 DIAGNOSIS — R531 Weakness: Secondary | ICD-10-CM | POA: Diagnosis not present

## 2019-01-22 ENCOUNTER — Other Ambulatory Visit: Payer: Self-pay | Admitting: Neurology

## 2019-02-07 DIAGNOSIS — G35 Multiple sclerosis: Secondary | ICD-10-CM | POA: Diagnosis not present

## 2019-02-07 DIAGNOSIS — I639 Cerebral infarction, unspecified: Secondary | ICD-10-CM | POA: Diagnosis not present

## 2019-02-07 DIAGNOSIS — R531 Weakness: Secondary | ICD-10-CM | POA: Diagnosis not present

## 2019-03-03 ENCOUNTER — Other Ambulatory Visit: Payer: Self-pay | Admitting: Neurology

## 2019-03-07 ENCOUNTER — Telehealth: Payer: Self-pay | Admitting: Internal Medicine

## 2019-03-07 NOTE — Telephone Encounter (Signed)
LM with pt husband. He stated pt was not in, but he will give her the message and have her call back. Explained to pt husband that Dr. Margaretann Loveless has sooner appointments available if pt would like to be seen sooner than 8/13. Informed that there are 4 available slots for an in office visit on 7/29.  Pt husband verbalized understanding and will have pt call us back.

## 2019-03-09 DIAGNOSIS — I639 Cerebral infarction, unspecified: Secondary | ICD-10-CM | POA: Diagnosis not present

## 2019-03-09 DIAGNOSIS — R531 Weakness: Secondary | ICD-10-CM | POA: Diagnosis not present

## 2019-03-09 DIAGNOSIS — G35 Multiple sclerosis: Secondary | ICD-10-CM | POA: Diagnosis not present

## 2019-03-21 ENCOUNTER — Ambulatory Visit: Payer: Medicare Other | Admitting: Internal Medicine

## 2019-03-21 ENCOUNTER — Telehealth: Payer: Self-pay | Admitting: Internal Medicine

## 2019-03-21 NOTE — Telephone Encounter (Signed)
Home phone just rang, no VM.  Called cell phone and LVM for her to call back and reschedule with another cardiologist.

## 2019-03-28 ENCOUNTER — Ambulatory Visit: Payer: Medicare Other | Admitting: Internal Medicine

## 2019-04-08 ENCOUNTER — Encounter: Payer: Medicare Other | Admitting: Family Medicine

## 2019-04-09 DIAGNOSIS — G35 Multiple sclerosis: Secondary | ICD-10-CM | POA: Diagnosis not present

## 2019-04-09 DIAGNOSIS — R531 Weakness: Secondary | ICD-10-CM | POA: Diagnosis not present

## 2019-04-09 DIAGNOSIS — I639 Cerebral infarction, unspecified: Secondary | ICD-10-CM | POA: Diagnosis not present

## 2019-05-22 ENCOUNTER — Ambulatory Visit: Payer: Medicare Other | Admitting: Family Medicine

## 2019-05-24 ENCOUNTER — Ambulatory Visit (INDEPENDENT_AMBULATORY_CARE_PROVIDER_SITE_OTHER): Payer: Medicare Other | Admitting: Family Medicine

## 2019-05-24 ENCOUNTER — Other Ambulatory Visit: Payer: Self-pay

## 2019-05-24 ENCOUNTER — Encounter: Payer: Self-pay | Admitting: Family Medicine

## 2019-05-24 VITALS — BP 128/80 | HR 67 | Temp 96.4°F

## 2019-05-24 DIAGNOSIS — Z23 Encounter for immunization: Secondary | ICD-10-CM

## 2019-05-24 DIAGNOSIS — R600 Localized edema: Secondary | ICD-10-CM | POA: Diagnosis not present

## 2019-05-24 DIAGNOSIS — I10 Essential (primary) hypertension: Secondary | ICD-10-CM

## 2019-05-24 DIAGNOSIS — Z8673 Personal history of transient ischemic attack (TIA), and cerebral infarction without residual deficits: Secondary | ICD-10-CM | POA: Diagnosis not present

## 2019-05-24 DIAGNOSIS — E785 Hyperlipidemia, unspecified: Secondary | ICD-10-CM | POA: Diagnosis not present

## 2019-05-24 LAB — COMPREHENSIVE METABOLIC PANEL
ALT: 8 U/L (ref 0–35)
AST: 12 U/L (ref 0–37)
Albumin: 3.6 g/dL (ref 3.5–5.2)
Alkaline Phosphatase: 65 U/L (ref 39–117)
BUN: 13 mg/dL (ref 6–23)
CO2: 29 mEq/L (ref 19–32)
Calcium: 9.3 mg/dL (ref 8.4–10.5)
Chloride: 104 mEq/L (ref 96–112)
Creatinine, Ser: 0.59 mg/dL (ref 0.40–1.20)
GFR: 121.16 mL/min (ref >60.00)
Glucose, Bld: 91 mg/dL (ref 70–99)
Potassium: 3.9 mEq/L (ref 3.5–5.1)
Sodium: 141 mEq/L (ref 135–145)
Total Bilirubin: 0.7 mg/dL (ref 0.2–1.2)
Total Protein: 7 g/dL (ref 6.0–8.3)

## 2019-05-24 LAB — LIPID PANEL
Cholesterol: 206 mg/dL — ABNORMAL HIGH (ref 0–200)
HDL: 87.4 mg/dL (ref >39.00)
LDL Cholesterol: 104 mg/dL — ABNORMAL HIGH (ref 0–99)
NonHDL: 118.14
Total CHOL/HDL Ratio: 2
Triglycerides: 70 mg/dL (ref 0.0–149.0)
VLDL: 14 mg/dL (ref 0.0–40.0)

## 2019-05-24 MED ORDER — AMLODIPINE BESYLATE 5 MG PO TABS: 5.0000 mg | ORAL_TABLET | Freq: Every day | ORAL | 2 refills | Status: DC

## 2019-05-24 MED ORDER — ATORVASTATIN CALCIUM 10 MG PO TABS: 10.0000 mg | ORAL_TABLET | Freq: Every day | ORAL | 2 refills | Status: AC

## 2019-05-24 NOTE — Progress Notes (Signed)
Chief Complaint  Patient presents with  . Follow-up    Subjective Allison Bailey is a 73 y.o. female who presents for hypertension follow up. She does not monitor home blood pressures. She is compliant with medication- Norvasc 5 mg/d. Patient has these side effects of medication: none  She is adhering to a healthy diet overall. Current exercise: none  Hyperlipidemia Patient presents for dyslipidemia follow up. Currently being treated with Lipitor 10 mg/d and compliance with treatment thus far has been good. She denies myalgias. She is adhering to a healthy diet. Exercise: none The patient is not known to have coexisting coronary artery disease.  Over the last 2 months, the patient is also had swelling in her left lower extremity.  There is no injury or change in activity.  No surgeries, history of prolonged bedrest, or personal/family history of DVT/PE.  She is not having any pain.   Past Medical History:  Diagnosis Date  . Hyperlipidemia   . Hypertension   . MS (multiple sclerosis) ([place])   . Stroke ([place])   . Tuberculosis     Review of Systems Cardiovascular: no chest pain Respiratory:  no shortness of breath  Exam BP 128/80 (BP Location: Left Arm, Patient Position: Sitting, Cuff Size: Normal)   Pulse 67   Temp (!) 96.4 F (35.8 C) (Temporal)   SpO2 97%  General:  well developed, well nourished, in no apparent distress Heart: RRR, no bruits, trace right-sided lower extremity edema that is pitting; 3+ pitting edema tapering up to the knee on the left lower extremity MSK: No tenderness to palpation of the calves, negative Homans bilaterally Lungs: clear to auscultation, no accessory muscle use Psych: well oriented with normal range of affect and appropriate judgment/insight  Hyperlipidemia, unspecified hyperlipidemia type - Plan: Comprehensive metabolic panel, Lipid panel  Need for influenza vaccination - Plan: Flu Vaccine QUAD High Dose(Fluad)  Lower extremity  edema  Essential hypertension - Plan: amLODipine (NORVASC) 5 MG tablet  History of stroke - Plan: atorvastatin (LIPITOR) 10 MG tablet  Orders as above. Counseled on diet and exercise. Prescription for compression stocking given.  Elevate legs, stay active, mind salt intake. F/u in 6 mo for CPE or prn. The patient and her son voiced understanding and agreement to the plan.  [place], DO 05/24/19  10:51 AM

## 2019-05-24 NOTE — Patient Instructions (Addendum)
Take a baby aspirin daily.   Give [place] 2-3 business days to get the results of your labs back.    Keep the diet clean and stay active.   For the swelling in your lower extremities, be sure to elevate your legs when able, mind the salt intake, stay physically active and consider wearing compression stockings.  Let us know if you need anything.

## 2019-06-11 ENCOUNTER — Ambulatory Visit (INDEPENDENT_AMBULATORY_CARE_PROVIDER_SITE_OTHER): Payer: Medicare Other | Admitting: Adult Health

## 2019-06-11 ENCOUNTER — Encounter: Payer: Self-pay | Admitting: Adult Health

## 2019-06-11 ENCOUNTER — Other Ambulatory Visit: Payer: Self-pay

## 2019-06-11 VITALS — BP 128/82 | HR 61 | Ht 64.5 in | Wt 189.0 lb

## 2019-06-11 DIAGNOSIS — G35 Multiple sclerosis: Secondary | ICD-10-CM

## 2019-06-11 DIAGNOSIS — R609 Edema, unspecified: Secondary | ICD-10-CM

## 2019-06-11 DIAGNOSIS — I10 Essential (primary) hypertension: Secondary | ICD-10-CM | POA: Diagnosis not present

## 2019-06-11 MED ORDER — HYDROCHLOROTHIAZIDE 12.5 MG PO CAPS: 12.5000 mg | ORAL_CAPSULE | Freq: Every day | ORAL | 3 refills | Status: AC

## 2019-06-11 MED ORDER — AMLODIPINE BESYLATE 2.5 MG PO TABS: 2.5000 mg | ORAL_TABLET | Freq: Every day | ORAL | 3 refills | Status: AC

## 2019-06-11 NOTE — Progress Notes (Signed)
Cardiology Office Note   Date:  06/11/2019   ID:  Allison, Bailey 08-12-1946, MRN [id number]  PCP:  Allison Dory, DO  Cardiologist:  Dr.End  CC: Follow Up LEE   History of Present Illness: Allison Bailey is a 73 y.o. female who presents for ongoing assessment and management of hypertension, hyperlipidemia, multiple sclerosis and systolic heart murmur.  Was last seen in the office by Dr. Okey Bailey on 06/30/2016 to be established in our practice as a new patient.  Dr. Okey Bailey planned an echocardiogram  Echo on 07/18/2016 revealed LVEF of 65% to 70% normal wall motion, mild to moderate aortic regurgitation with calcified mitral valve annulus no further plan testing was initiated at that time..  The patient was admitted to the hospital in 06/2018 with urinary tract infection, with worsening right arm weakness and left leg weakness.  Neurology followed her and felt that symptoms were due to chronic stroke (found incidentally on MRI) or MS or possibly due to UTI.  The patient was treated with antibiotics and followed up at home with physical therapy.  She is noted to have chronic lower extremity edema.  Follow-up echocardiogram which was completed on 10/30/2017, revealed LVEF of 55% to 60%, mild aortic valve regurgitation, no wall motion abnormalities, she did have grade 1 diastolic dysfunction.  She comes today with her son with complaints of chronic lower extremity edema.  She has noted worsening edema over the last year.  She denies any chest pain, dyspnea, PND or orthopnea.  Her activity is declining due to MS and gait disturbances associated with this.  She is mostly sedentary and uses a wheelchair for ambulation.  Past Medical History:  Diagnosis Date  . Hyperlipidemia   . Hypertension   . MS (multiple sclerosis) (HCC)   . Stroke (HCC)   . Tuberculosis     Past Surgical History:  Procedure Laterality Date  . ABDOMINAL HYSTERECTOMY    . FLEXIBLE SIGMOIDOSCOPY    . left breast  biopsy    . TUBAL LIGATION       Current Outpatient Medications  Medication Sig Dispense Refill  . acetaminophen (TYLENOL) 500 MG tablet Take 1,000 mg by mouth every 6 (six) hours as needed for mild pain.    Marland Kitchen amLODipine (NORVASC) 2.5 MG tablet Take 1 tablet (2.5 mg total) by mouth daily. 90 tablet 3  . aspirin EC 81 MG tablet Take 1 tablet (81 mg total) by mouth daily. 30 tablet 0  . atorvastatin (LIPITOR) 10 MG tablet Take 1 tablet (10 mg total) by mouth daily at 6 PM. 90 tablet 2  . hydrochlorothiazide (MICROZIDE) 12.5 MG capsule Take 1 capsule (12.5 mg total) by mouth daily. 90 capsule 3   No current facility-administered medications for this visit.     Allergies:   Patient has no known allergies.    Social History:  The patient  reports that she quit smoking about 41 years ago. She has a 40.00 pack-year smoking history. She has never used smokeless tobacco. She reports that she does not drink alcohol or use drugs.   Family History:  The patient's family history includes Cancer in her mother; Hypertension in her unknown relative.    ROS: All other systems are reviewed and negative. Unless otherwise mentioned in H&P    PHYSICAL EXAM: VS:  BP 128/82   Pulse 61   Ht 5' 4.5" (1.638 m)   Wt 189 lb (85.7 kg)   SpO2 100%   BMI 31.94 kg/m  ,  BMI Body mass index is 31.94 kg/m. GEN: Well nourished, well developed, in no acute distress HEENT: normal Neck: no JVD, carotid bruits, or masses Cardiac: RRR; no murmurs, rubs, or gallops, bilateral 2+ to 3+ pitting dependent edema .left leg greater than right leg. Respiratory:  Clear to auscultation bilaterally, normal work of breathing GI: soft, nontender, nondistended, + BS MS: no deformity or atrophy Skin: warm and dry, no rash Neuro:  Strength and sensation are intact Psych: euthymic mood, full affect   EKG: Normal sinus rhythm with evidence of anterior infarct age undetermined rate of 61 bpm  Recent Labs: 06/27/2018:  Magnesium 2.0 10/29/2018: Hemoglobin 13.5; Platelets 308.0 05/24/2019: ALT 8; BUN 13; Creatinine, Ser 0.59; Potassium 3.9; Sodium 141    Lipid Panel    Component Value Date/Time   CHOL 206 (H) 05/24/2019 1025   TRIG 70.0 05/24/2019 1025   TRIG 50 07/11/2006 1110   HDL 87.40 05/24/2019 1025   CHOLHDL 2 05/24/2019 1025   VLDL 14.0 05/24/2019 1025   LDLCALC 104 (H) 05/24/2019 1025   LDLDIRECT 95.4 04/04/2012 1200      Wt Readings from Last 3 Encounters:  06/11/19 189 lb (85.7 kg)  04/02/18 137 lb (62.1 kg)  10/02/17 161 lb (73 kg)      Other studies Reviewed: Echocardiogram 11-13-17 Left ventricle: The cavity size was normal. Systolic function was   normal. The estimated ejection fraction was in the range of 55%   to 60%. Wall motion was normal; there were no regional wall   motion abnormalities. Doppler parameters are consistent with   abnormal left ventricular relaxation (grade 1 diastolic   dysfunction). - Ventricular septum: The outflow septum had a sigmoid appearance. - Aortic valve: There was mild regurgitation.   ASSESSMENT AND PLAN:  1.  Chronic lower extremity edema: I believe this is multifactorial in the setting of dependent edema, use of calcium channel blocker, inactivity, high salt diet and what appears to be lymphedema on the left.  The patient does not keep her feet elevated when she is at home and does eat a good bit of snack foods and salty foods.  I will add HCTZ 12.5 mg daily to assist with fluid retention, and decrease amlodipine to 2.5 mg daily to prevent hypotension with addition of diuretic.  I have checked her BMET and found that her kidney function on 05/23/2019 was normal with a creatinine of 0.59.  I will recheck BMET in 1 month prior to seeing her for follow-up to check her response to medications.  2.  Hypertension: Blood pressures currently well controlled on amlodipine 5 mg daily.  However due to addition of HCTZ as described above will decrease  dose to 2.5 mg.  3.  Multiple sclerosis: Followed by neurology. Current medicines are reviewed at length with the patient today.    Labs/ tests ordered today include: BMET  Allison Bailey. Allison Bailey, ANP, AACC   06/11/2019 4:44 PM    All[facility] [place] [address] Office [phone] Fax [phone]  Notice: This dictation was prepared with Dragon dictation along with smaller phrase technology. Any transcriptional errors that result from this process are unintentional and may not be corrected upon review.

## 2019-06-11 NOTE — Patient Instructions (Signed)
Medication Instructions:  DECREASE- Amlodipine 2.5 mg by mouth daily START- Hydrochlorothiazide 12.5 mg by mouth daily  *If you need a refill on your cardiac medications before your next appointment, please call your pharmacy*  Lab Work: BMP in 1 Month  If you have labs (blood work) drawn today and your tests are completely normal, you will receive your results only by: Marland Kitchen MyChart Message (if you have MyChart) OR . A paper copy in the mail If you have any lab test that is abnormal or we need to change your treatment, we will call you to review the results.  Testing/Procedures: None Ordered  Follow-Up: At [facility], you and your health needs are our priority.  As part of our continuing mission to provide you with exceptional heart care, we have created designated Provider Care Teams.  These Care Teams include your primary Cardiologist (physician) and Advanced Practice Providers (APPs -  Physician Assistants and Nurse Practitioners) who all work together to provide you with the care you need, when you need it.  Your next appointment:   1 Month  The format for your next appointment:   In Person  Provider:     Jory Sims, DNP, ANP

## 2019-11-21 ENCOUNTER — Other Ambulatory Visit: Payer: Self-pay

## 2019-11-21 NOTE — Progress Notes (Deleted)
Subjective:   Allison Bailey is a 74 y.o. female who presents for an Initial Medicare Annual Wellness Visit.  Review of Systems      Home Safety/Smoke Alarms: Feels safe in home. Smoke alarms in place.     Female:      Mammo-       Dexa scan-        CCS-    Objective:    There were no vitals filed for this visit. There is no height or weight on file to calculate BMI.  Advanced Directives 06/26/2018  Does Patient Have a Medical Advance Directive? No  Would patient like information on creating a medical advance directive? No - Patient declined    Current Medications (verified) Outpatient Encounter Medications as of 11/22/2019  Medication Sig  . acetaminophen (TYLENOL) 500 MG tablet Take 1,000 mg by mouth every 6 (six) hours as needed for mild pain.  Marland Kitchen amLODipine (NORVASC) 2.5 MG tablet Take 1 tablet (2.5 mg total) by mouth daily.  Marland Kitchen atorvastatin (LIPITOR) 10 MG tablet Take 1 tablet (10 mg total) by mouth daily at 6 PM.  . hydrochlorothiazide (MICROZIDE) 12.5 MG capsule Take 1 capsule (12.5 mg total) by mouth daily.   No facility-administered encounter medications on file as of 11/22/2019.    Allergies (verified) Patient has no known allergies.   History: Past Medical History:  Diagnosis Date  . Hyperlipidemia   . Hypertension   . MS (multiple sclerosis) ([place])   . Stroke ([place])   . Tuberculosis    Past Surgical History:  Procedure Laterality Date  . ABDOMINAL HYSTERECTOMY    . FLEXIBLE SIGMOIDOSCOPY    . left breast biopsy    . TUBAL LIGATION     Family History  Problem Relation Age of Onset  . Cancer Mother        questionable   . Hypertension Unknown   . Heart disease Neg Hx    Social History   Socioeconomic History  . Marital status: Married    Spouse name: Not on file  . Number of children: 2  . Years of education: 12+  . Highest education level: Not on file  Occupational History  . Not on file  Tobacco Use  . Smoking status: Former Smoker   Packs/day: 2.00    Years: 20.00    Pack years: 40.00    Quit date: 1979    Years since quitting: 42.2  . Smokeless tobacco: Never Used  Substance and Sexual Activity  . Alcohol use: No  . Drug use: No  . Sexual activity: Not Currently  Other Topics Concern  . Not on file  Social History Narrative   Married for 44 years   Two boys    Caffeine use: soda, tea sometimes.   Right handed    She worked at a bank for 20 years    Social Determinants of Radio broadcast assistant Strain:   . Difficulty of Paying Living Expenses:   Food Insecurity:   . Worried About Charity fundraiser in the Last Year:   . Arboriculturist in the Last Year:   Transportation Needs:   . Film/video editor (Medical):   Marland Kitchen Lack of Transportation (Non-Medical):   Physical Activity:   . Days of Exercise per Week:   . Minutes of Exercise per Session:   Stress:   . Feeling of Stress :   Social Connections:   . Frequency of Communication with Friends and  Family:   . Frequency of Social Gatherings with Friends and Family:   . Attends Religious Services:   . Active Member of Clubs or Organizations:   . Attends Banker Meetings:   Marland Kitchen Marital Status:     Tobacco Counseling Counseling given: Not Answered   Clinical Intake:                        Activities of Daily Living No flowsheet data found.   Immunizations and Health Maintenance Immunization History  Administered Date(s) Administered  . Fluad Quad(high Dose 65+) 05/24/2019  . Influenza Split 09/08/2011, 07/05/2012  . Influenza Whole 08/16/1999, 06/12/2007  . Influenza, High Dose Seasonal PF 05/17/2016  . Pneumococcal Conjugate-13 05/17/2016  . Pneumococcal Polysaccharide-23 04/02/2018  . Td 08/15/2005  . Tdap 04/02/2018   Health Maintenance Due  Topic Date Due  . Hepatitis C Screening  Never done  . COLONOSCOPY  Never done  . DEXA SCAN  02/29/2012  . MAMMOGRAM  05/11/2014    Patient Care  Team: Sharlene Dory, DO as PCP - General (Family Medicine)  Indicate any recent Medical Services you may have received from other than [facility] providers in the past year (date may be approximate).     Assessment:   This is a routine wellness examination for Allison Bailey. Physical assessment deferred to PCP.  Hearing/Vision screen No exam data present  Dietary issues and exercise activities discussed:   Diet (meal preparation, eat out, water intake, caffeinated beverages, dairy products, fruits and vegetables): {Desc; diets:16563} Breakfast: Lunch:  Dinner:      Goals   None    Depression Screen No flowsheet data found.  Fall Risk Fall Risk  07/02/2018  Falls in the past year? 1  Number falls in past yr: 1  Injury with Fall? 0  Risk for fall due to : History of fall(s);Impaired balance/gait    Cognitive Function:   Ad8 score reviewed for issues:  Issues making decisions:  Less interest in hobbies / activities:  Repeats questions, stories (family complaining):  Trouble using ordinary gadgets (microwave, computer, phone):  Forgets the month or year:   Mismanaging finances:   Remembering appts:  Daily problems with thinking and/or memory: Ad8 score is=          Screening Tests Health Maintenance  Topic Date Due  . Hepatitis C Screening  Never done  . COLONOSCOPY  Never done  . DEXA SCAN  02/29/2012  . MAMMOGRAM  05/11/2014  . INFLUENZA VACCINE  03/15/2020  . TETANUS/TDAP  04/02/2028  . PNA vac Low Risk Adult  Completed      Plan:   ***  I have personally reviewed and noted the following in the patient's chart:   . Medical and social history . Use of alcohol, tobacco or illicit drugs  . Current medications and supplements . Functional ability and status . Nutritional status . Physical activity . Advanced directives . List of other physicians . Hospitalizations, surgeries, and ER visits in previous 12 months . Vitals . Screenings to  include cognitive, depression, and falls . Referrals and appointments  In addition, I have reviewed and discussed with patient certain preventive protocols, quality metrics, and best practice recommendations. A written personalized care plan for preventive services as well as general preventive health recommendations were provided to patient.     [place]on [place], [place]   11/21/2019

## 2019-11-22 ENCOUNTER — Ambulatory Visit (INDEPENDENT_AMBULATORY_CARE_PROVIDER_SITE_OTHER): Payer: Medicare Other | Admitting: Family Medicine

## 2019-11-22 ENCOUNTER — Ambulatory Visit: Payer: Medicare Other | Admitting: *Deleted

## 2019-11-22 ENCOUNTER — Other Ambulatory Visit: Payer: Self-pay

## 2019-11-22 ENCOUNTER — Encounter: Payer: Self-pay | Admitting: Family Medicine

## 2019-11-22 VITALS — BP 128/82 | HR 65 | Temp 97.4°F | Resp 16 | Ht 64.5 in | Wt 175.0 lb

## 2019-11-22 DIAGNOSIS — E2839 Other primary ovarian failure: Secondary | ICD-10-CM | POA: Diagnosis not present

## 2019-11-22 DIAGNOSIS — Z1211 Encounter for screening for malignant neoplasm of colon: Secondary | ICD-10-CM

## 2019-11-22 DIAGNOSIS — Z1159 Encounter for screening for other viral diseases: Secondary | ICD-10-CM

## 2019-11-22 DIAGNOSIS — Z1231 Encounter for screening mammogram for malignant neoplasm of breast: Secondary | ICD-10-CM

## 2019-11-22 DIAGNOSIS — Z0000 Encounter for general adult medical examination without abnormal findings: Secondary | ICD-10-CM

## 2019-11-22 LAB — COMPREHENSIVE METABOLIC PANEL
ALT: 7 U/L (ref 0–35)
AST: 10 U/L (ref 0–37)
Albumin: 3.8 g/dL (ref 3.5–5.2)
Alkaline Phosphatase: 75 U/L (ref 39–117)
BUN: 14 mg/dL (ref 6–23)
CO2: 28 mEq/L (ref 19–32)
Calcium: 9.3 mg/dL (ref 8.4–10.5)
Chloride: 105 mEq/L (ref 96–112)
Creatinine, Ser: 0.56 mg/dL (ref 0.40–1.20)
GFR: 128.5 mL/min (ref >60.00)
Glucose, Bld: 90 mg/dL (ref 70–99)
Potassium: 3.5 mEq/L (ref 3.5–5.1)
Sodium: 141 mEq/L (ref 135–145)
Total Bilirubin: 1.3 mg/dL — ABNORMAL HIGH (ref 0.2–1.2)
Total Protein: 6.6 g/dL (ref 6.0–8.3)

## 2019-11-22 LAB — LIPID PANEL
Cholesterol: 225 mg/dL — ABNORMAL HIGH (ref 0–200)
HDL: 89.9 mg/dL (ref >39.00)
LDL Cholesterol: 114 mg/dL — ABNORMAL HIGH (ref 0–99)
NonHDL: 134.85
Total CHOL/HDL Ratio: 3
Triglycerides: 106 mg/dL (ref 0.0–149.0)
VLDL: 21.2 mg/dL (ref 0.0–40.0)

## 2019-11-22 LAB — CBC
HCT: 41.5 % (ref 36.0–46.0)
Hemoglobin: 13.4 g/dL (ref 12.0–15.0)
MCHC: 32.2 g/dL (ref 30.0–36.0)
MCV: 82.4 fl (ref 78.0–100.0)
Platelets: 275 10*3/uL (ref 150.0–400.0)
RBC: 5.04 Mil/uL (ref 3.87–5.11)
RDW: 15.1 % (ref 11.5–15.5)
WBC: 4.3 10*3/uL (ref 4.0–10.5)

## 2019-11-22 NOTE — Progress Notes (Signed)
Chief Complaint  Patient presents with  . Annual Exam     Well Woman Allison Bailey is here for a complete physical.  Here w son.  Her last physical was >1 year ago.  Current diet: in general, diet is fair. Current exercise: none. Weight is stable and she denies daytime fatigue. Seatbelt? Yes  Health Maintenance Colonoscopy- No Shingrix- No DEXA- Yes Mammogram- No Tetanus- Yes Pneumonia- Yes Hep C screen- No  Past Medical History:  Diagnosis Date  . Hyperlipidemia   . Hypertension   . MS (multiple sclerosis) ([place])   . Stroke ([place])   . Tuberculosis      Past Surgical History:  Procedure Laterality Date  . ABDOMINAL HYSTERECTOMY    . FLEXIBLE SIGMOIDOSCOPY    . left breast biopsy    . TUBAL LIGATION      Medications  Current Outpatient Medications on File Prior to Visit  Medication Sig Dispense Refill  . acetaminophen (TYLENOL) 500 MG tablet Take 1,000 mg by mouth every 6 (six) hours as needed for mild pain.    Marland Kitchen amLODipine (NORVASC) 2.5 MG tablet Take 1 tablet (2.5 mg total) by mouth daily. 90 tablet 3  . atorvastatin (LIPITOR) 10 MG tablet Take 1 tablet (10 mg total) by mouth daily at 6 PM. 90 tablet 2  . hydrochlorothiazide (MICROZIDE) 12.5 MG capsule Take 1 capsule (12.5 mg total) by mouth daily. 90 capsule 3    Allergies No Known Allergies  Review of Systems: Constitutional:  no fevers Eye:  no recent significant change in vision Ears:  No changes in hearing Nose/Mouth/Throat:  no complaints of nasal congestion, no sore throat Cardiovascular: no chest pain Respiratory:  No shortness of breath Gastrointestinal:  No change in bowel habits GU:  Female: negative for dysuria Integumentary:  no abnormal skin lesions reported Neurologic:  no headaches Endocrine:  denies unexplained weight changes  Exam BP 128/82 (BP Location: Left Arm, Patient Position: Sitting, Cuff Size: Normal)   Pulse 65   Temp (!) 97.4 F (36.3 C) (Temporal)   Resp 16   Ht 5'  4.5" (1.638 m)   Wt 175 lb (79.4 kg)   SpO2 100%   BMI 29.57 kg/m  General:  well developed, well nourished, in no apparent distress Skin:  no significant moles, warts, or growths Head:  no masses, lesions, or tenderness Eyes:  pupils equal and round, sclera anicteric without injection Ears:  canals without lesions, TMs shiny without retraction, no obvious effusion, no erythema Nose:  nares patent, septum midline, mucosa normal, and no drainage or sinus tenderness Throat/Pharynx:  lips and gingiva without lesion; tongue and uvula midline; non-inflamed pharynx; no exudates or postnasal drainage Neck: neck supple without adenopathy, thyromegaly, or masses Lungs:  clear to auscultation, breath sounds equal bilaterally, no respiratory distress Cardio:  regular rate and rhythm, no bruits or LE edema Abdomen:  abdomen soft, nontender; bowel sounds normal; no masses or organomegaly Genital: Deferred Neuro:  gait normal; deep tendon reflexes normal and symmetric Psych: well oriented with normal range of affect and limited judgment/insight  Assessment and Plan  Well adult exam - Plan: CBC, Comprehensive metabolic panel, Lipid panel  Encounter for screening mammogram for malignant neoplasm of breast - Plan: MM DIGITAL SCREENING BILATERAL  Estrogen deficiency - Plan: DG Bone Density  Encounter for hepatitis C screening test for low risk patient - Plan: Hepatitis C antibody  Screen for colon cancer - Plan: Ambulatory referral to Gastroenterology   Well 74 y.o. female.  Counseled on diet and exercise. Needs to elevate legs and walk. If there is weakness or balance concerns, they will let me know an we will set her up with PT.  Shingrix info given.  Other orders as above. Follow up in 6 mo. The patient and her son voiced understanding and agreement to the plan.  Allison Roche [place], DO 11/22/19 9:52 AM

## 2019-11-22 NOTE — Patient Instructions (Addendum)
Give us 2-3 business days to get the results of your labs back.   Keep the diet clean and stay active.  The new Shingrix vaccine (for shingles) is a 2 shot series. It can make people feel low energy, achy and almost like they have the flu for 48 hours after injection. Please plan accordingly when deciding on when to get this shot. Call your pharmacy to get this. The second shot of the series is less severe regarding the side effects, but it still lasts 48 hours.   If you do not hear anything about your referral in the next 1-2 weeks, call our office and ask for an update.  Let us know if you need anything. 

## 2019-11-25 LAB — HEPATITIS C ANTIBODY
Hepatitis C Ab: NONREACTIVE
SIGNAL TO CUT-OFF: 0.01 (ref <1.00)

## 2020-01-19 IMAGING — MR MR HEAD WO/W CM
14 of 17 series · 32 of 48 positions shown · IV contrast (gadavist)
Comparison: MRI of the head May 01, 2012 and CT Salle[facility] Alarbi

CLINICAL DATA: Worsening RIGHT arm and LEFT leg weakness. History
of multiple sclerosis, hypertension hyperlipidemia.

EXAM:
MRI HEAD WITHOUT AND WITH CONTRAST
TECHNIQUE: Multiplanar, multiecho pulse sequences of the brain and surrounding
structures were obtained without and with intravenous contrast.
CONTRAST:  6 cc Gadavist

[Series 5: DWI · axial · 4.0mm · 0.88mm/px · z∈[-17,+115]mm · 3 of 70 slices shown (1 of 4)]
[im 1/70]
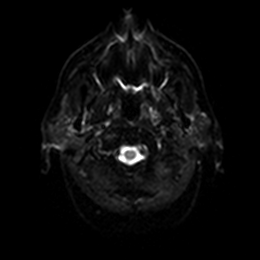
[im 35/70]
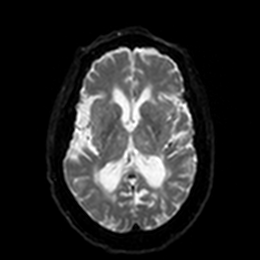
[im 70/70]
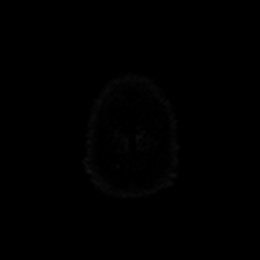

[Series 6: DWI · axial · 4.0mm · 0.88mm/px · z∈[-17,+115]mm · 2 of 35 slices shown (2 of 4)]
[im 1/35]
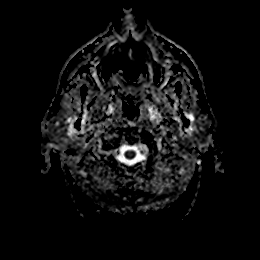
[im 35/35]
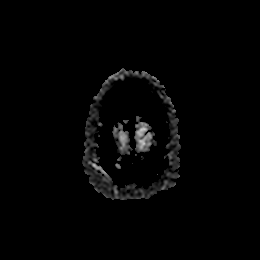

[Series 7: DWI · coronal · 4.0mm · 0.88mm/px · 3 of 68 slices shown (3 of 4)]
[im 1/68]
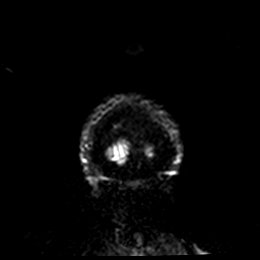
[im 34/68]
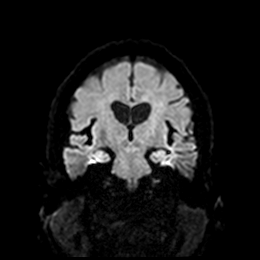
[im 68/68]
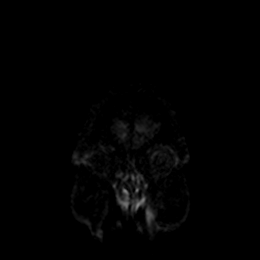

[Series 8: DWI · coronal · 4.0mm · 0.88mm/px · 2 of 34 slices shown (4 of 4)]
[im 1/34]
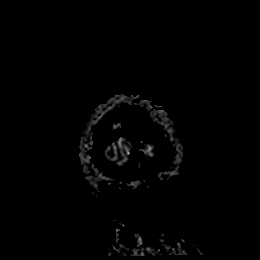
[im 34/34]
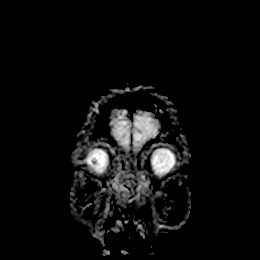

[Series 9: T1 · sagittal · 5.0mm · 0.75mm/px · 1 of 23 slices shown]
[im 1/23]
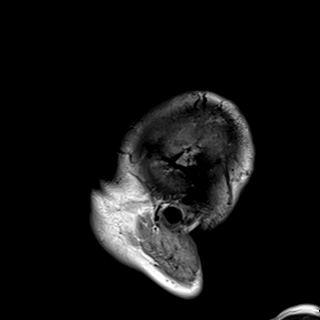

[Series 10: T2 · axial · 5.0mm · 0.72mm/px · 1 of 25 slices shown]
[im 1/25]
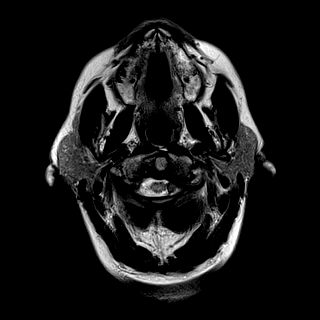

[Series 11: FLAIR · axial · 5.0mm · 0.45mm/px · 1 of 25 slices shown]
[im 1/25]
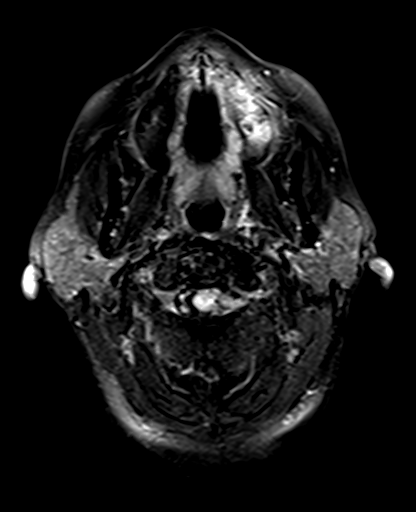

[Series 12: swi_images · axial · 3.0mm · 0.90mm/px · z∈[-33,+138]mm · 3 of 60 slices shown]
[im 1/60]
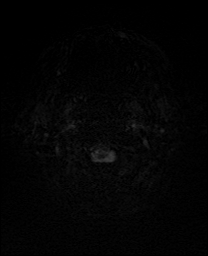
[im 30/60]
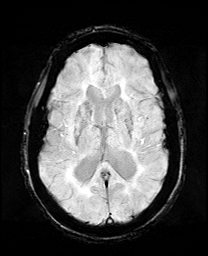
[im 60/60]
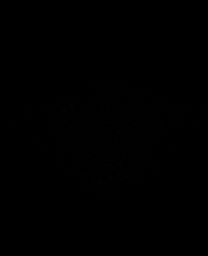

[Series 13: mip_images(sw) · axial · 24.0mm · 0.90mm/px · z∈[-23,+128]mm · 3 of 53 slices shown]
[im 1/53]
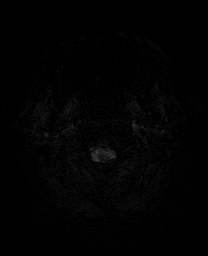
[im 27/53]
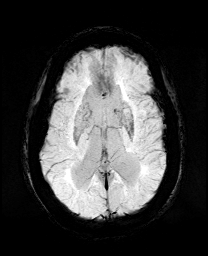
[im 53/53]
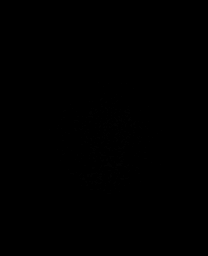

[Series 14: t2_space_dark-fluid_sag_p2_ns-ir · sagittal · 1.0mm · 0.49mm/px · 8 of 176 slices shown]
[im 1/176]
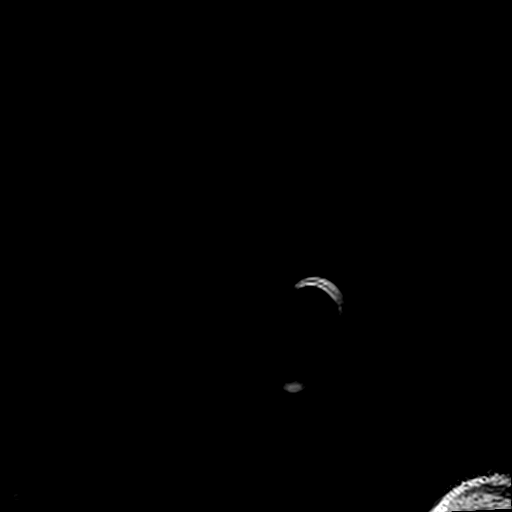
[im 26/176]
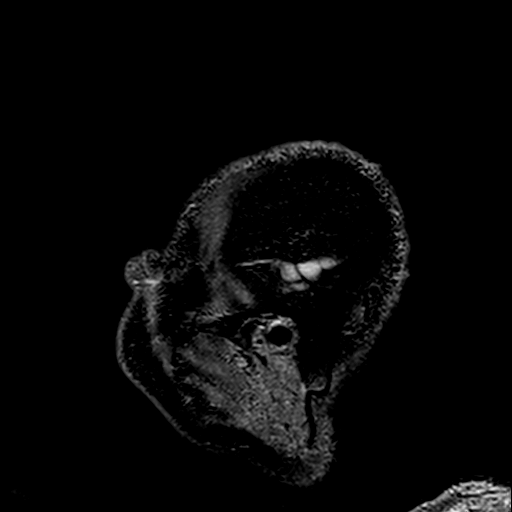
[im 51/176]
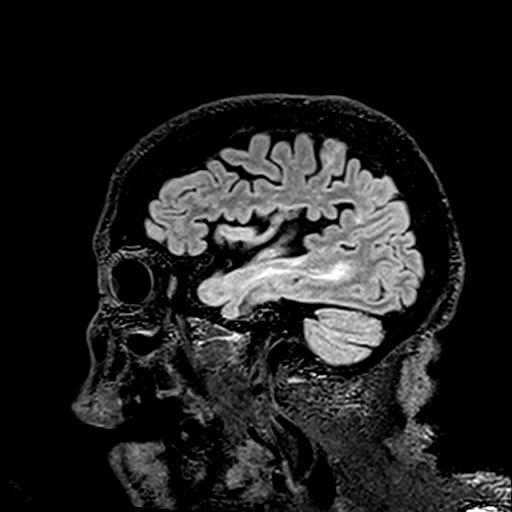
[im 76/176]
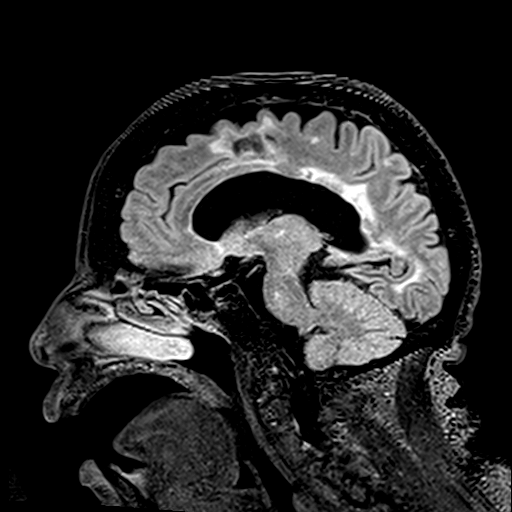
[im 101/176]
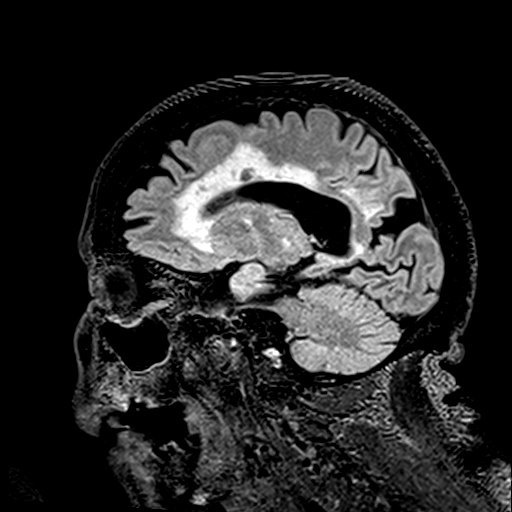
[im 126/176]
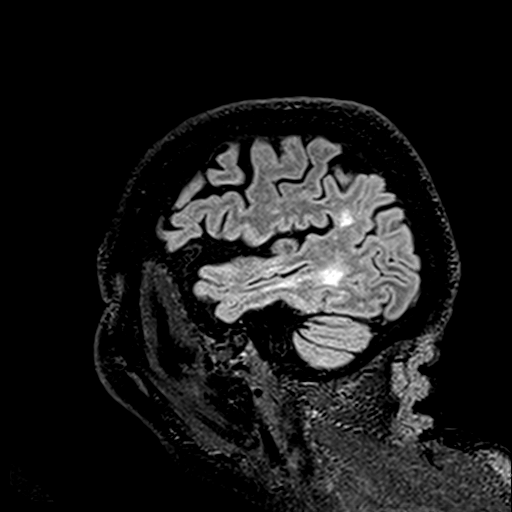
[im 151/176]
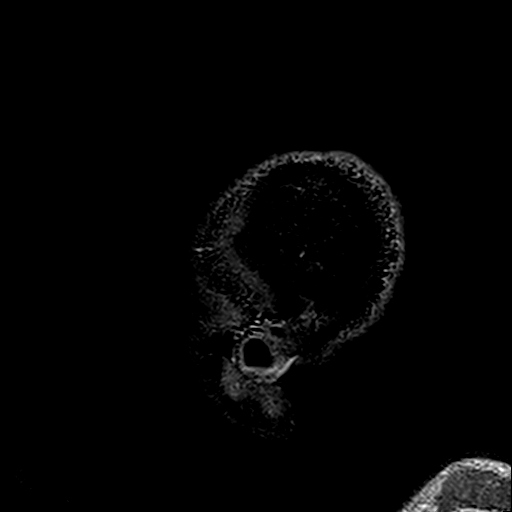
[im 176/176]
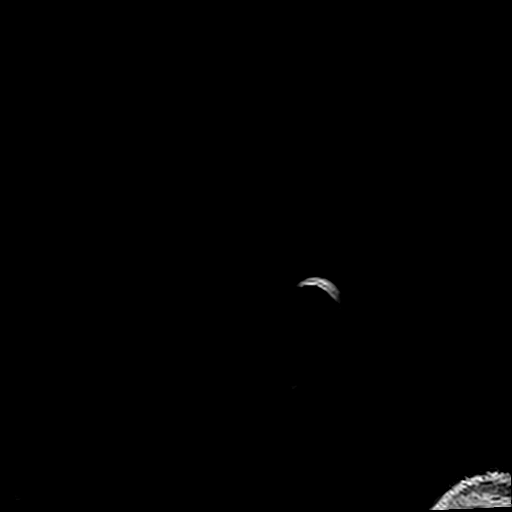

[Series 15: t2_space_dark-fluid_sag_p2_ns-ir_mpr_ axial · axial · 1.0mm · 0.45mm/px · z∈[-2,+20]mm · 2 of 120 slices shown]
[im 1/120]
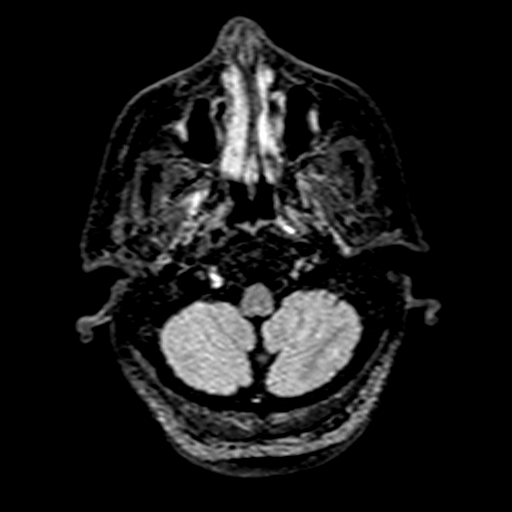
[im 24/120]
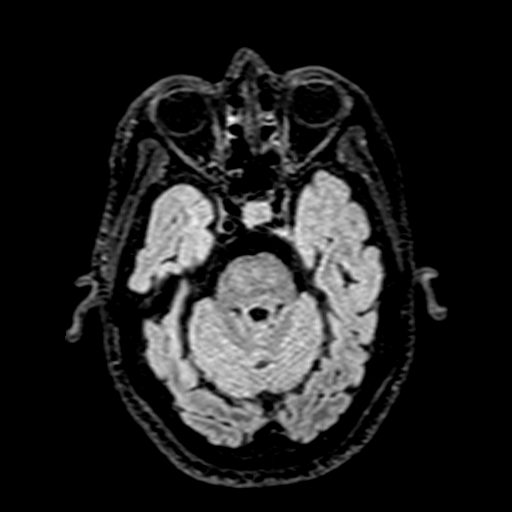

[Series 18: T2 post-contrast · coronal · 5.0mm · 0.72mm/px · 1 of 28 slices shown]
[im 1/28]
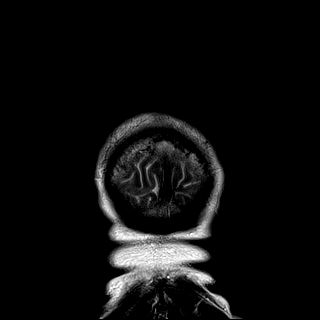

[Series 20: T1 post-contrast · coronal · 5.0mm · 0.34mm/px · 1 of 28 slices shown (1 of 2)]
[im 1/28]
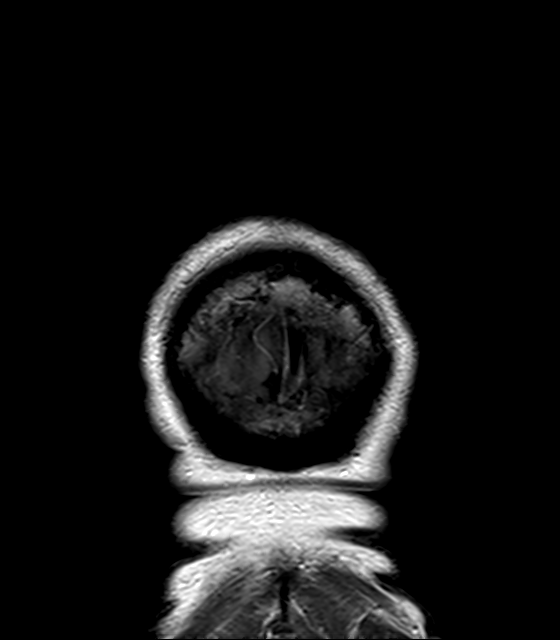

[Series 21: T1 post-contrast · sagittal · 5.0mm · 0.72mm/px · 1 of 23 slices shown (2 of 2)]
[im 1/23]
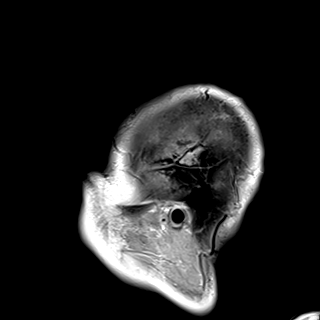

[32 of 48 positions shown; findings below may reference images not displayed]

FINDINGS: INTRACRANIAL CONTENTS: No reduced diffusion to suggest acute
ischemia or hyperacute demyelination. Worsening confluent many
lesions demonstrate low T1 signal seen with black holes of
demyelination and, demonstrate T2 shine through. Supratentorial
white matter FLAIR T2 hyperintensities with cystic components. At
least 1 LEFT cerebellar lesion. New LEFT pontine lacunar infarct.
Old patchy basal ganglia T2 hyperintensities. Prominent basal
ganglia perivascular spaces associated with chronic small vessel
ischemic changes. No midline shift, mass effect or masses. Moderate
parenchymal brain volume loss. No hydrocephalus. No abnormal
extra-axial fluid collections. Stable 11 mm LEFT frontal
homogeneously enhancing meningioma.

VASCULAR: Normal major intracranial vascular flow voids present at
skull base.

SKULL AND UPPER CERVICAL SPINE: No abnormal sellar expansion. No
suspicious calvarial bone marrow signal. Craniocervical junction
maintained.

SINUSES/ORBITS: Mild paranasal sinus mucosal thickening. Mastoid air
cells are well aerated. Included ocular globes and orbital contents
are non-suspicious.

OTHER: None.
IMPRESSION: 1. Worsening severe chronic demyelination superimposed on chronic
small vessel ischemic changes.
2. New nonacute LEFT thalamus lacunar infarct.
3. Moderate parenchymal brain volume loss, progressed from prior
examination.

## 2020-01-19 IMAGING — MR MR MRA HEAD W/O CM
1 series · 20 of 48 positions shown · non-contrast
Comparison: Brain MRI 06/27/2018

CLINICAL DATA: Stroke follow-up

EXAM:
MRA HEAD WITHOUT CONTRAST
TECHNIQUE: Angiographic images of the Circle of Willis were obtained using MRA
technique without intravenous contrast.

[Series 3: ax (id) 2 · axial · 1.0mm · 0.43mm/px · z∈[-3,+79]mm · 20 of 184 slices shown]
[im 1/184]
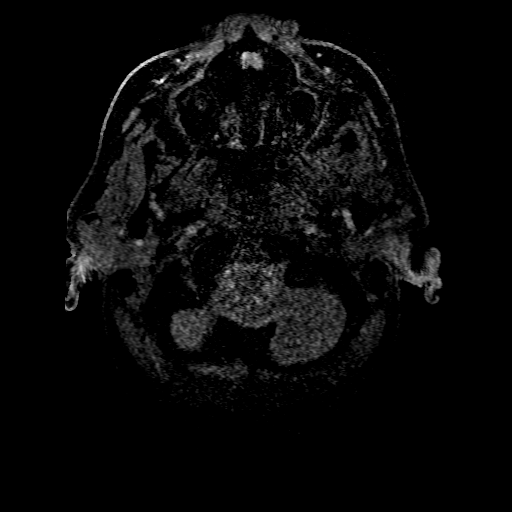
[im 4/184]
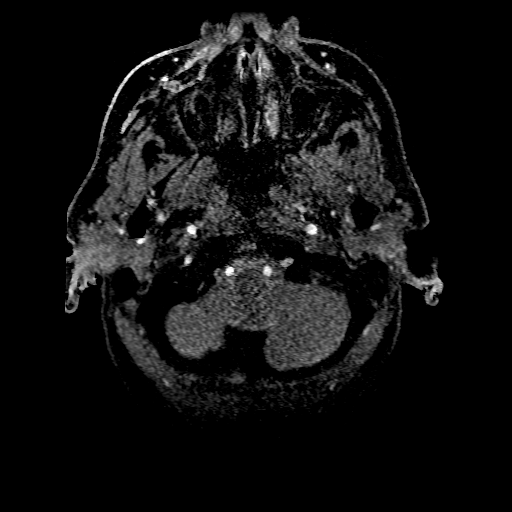
[im 8/184]
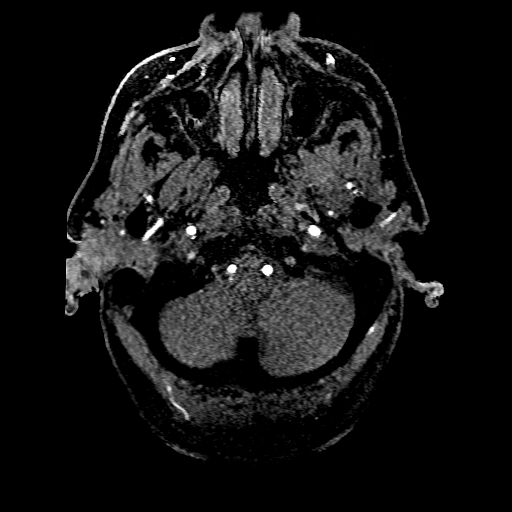
[im 12/184]
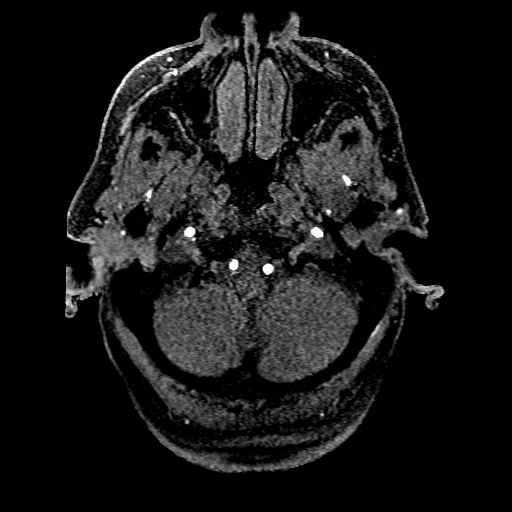
[im 16/184]
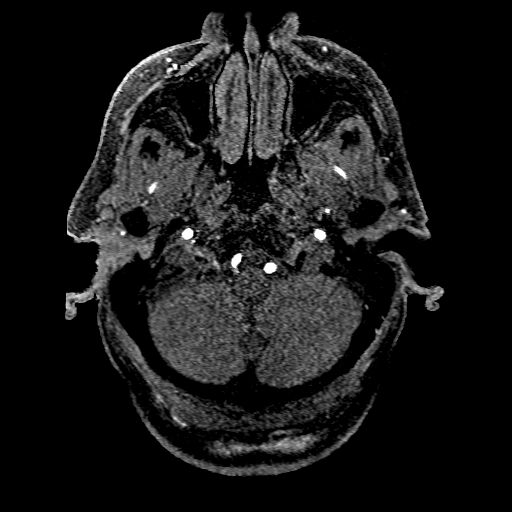
[im 20/184]
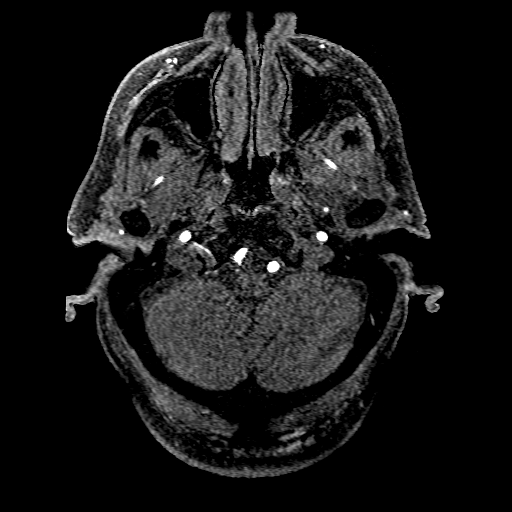
[im 24/184]
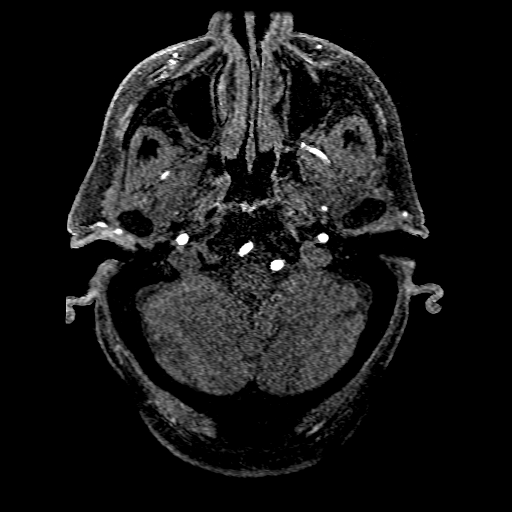
[im 28/184]
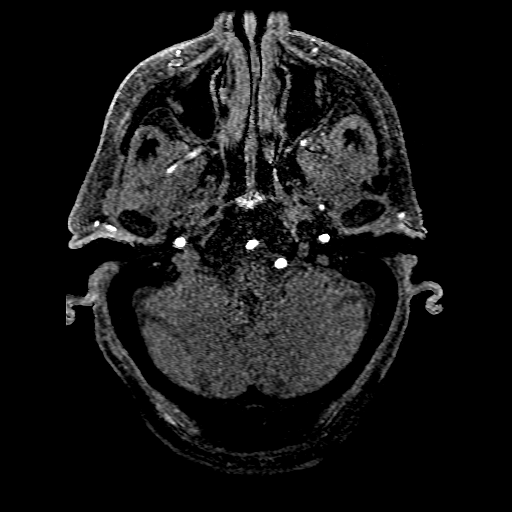
[im 32/184]
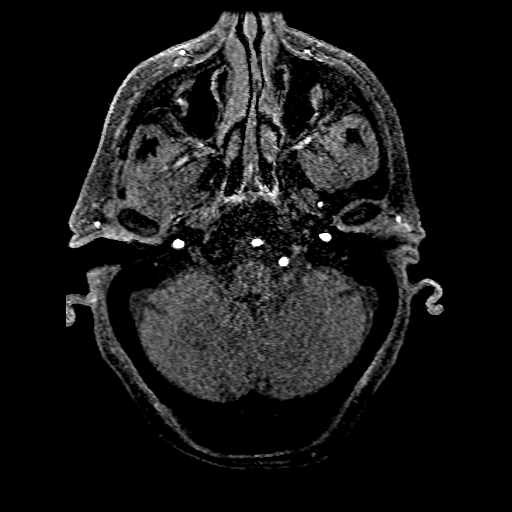
[im 36/184]
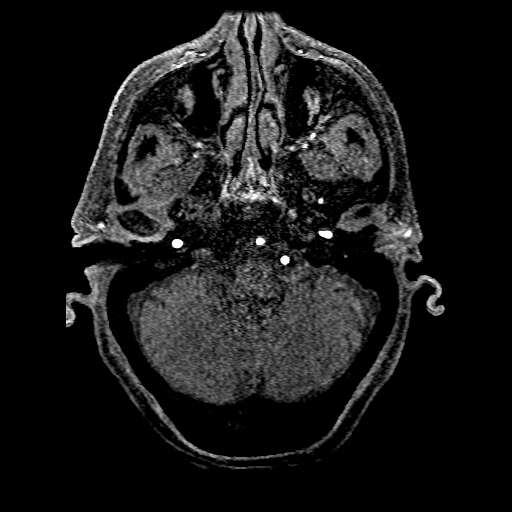
[im 39/184]
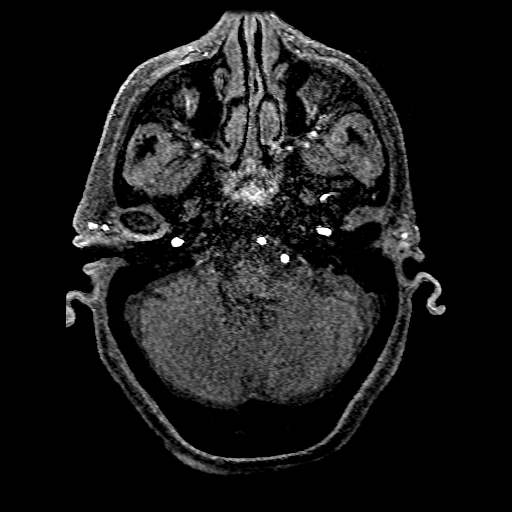
[im 43/184]
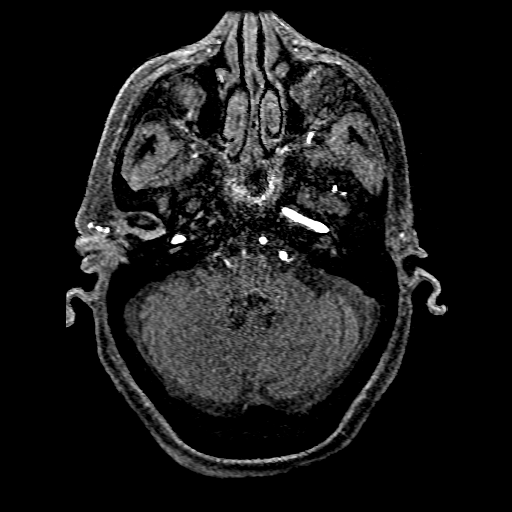
[im 59/184]
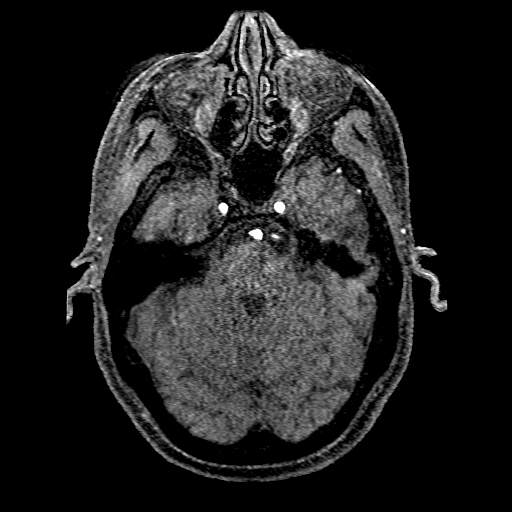
[im 82/184]
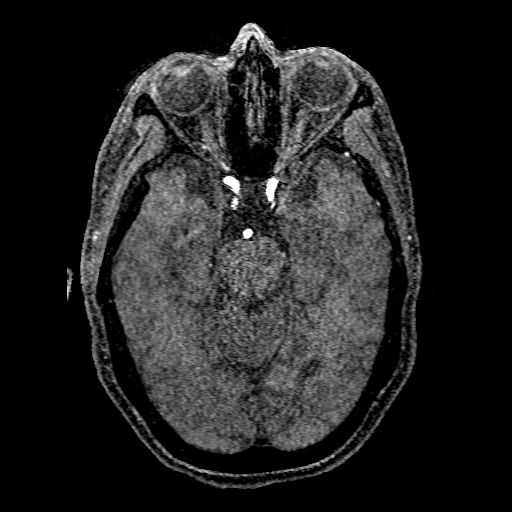
[im 94/184]
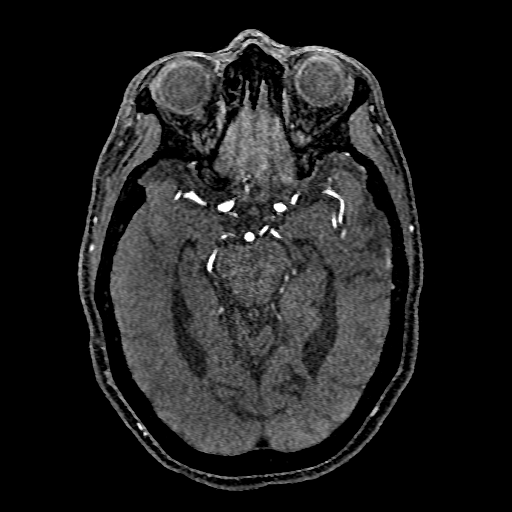
[im 106/184]
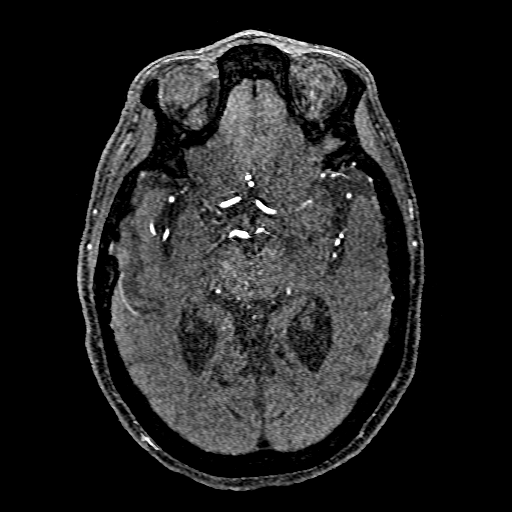
[im 129/184]
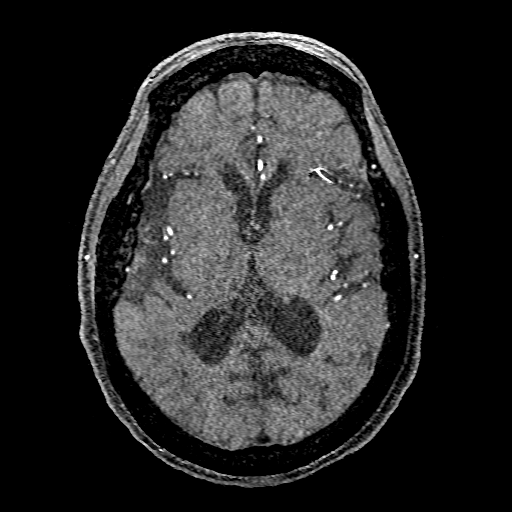
[im 152/184]
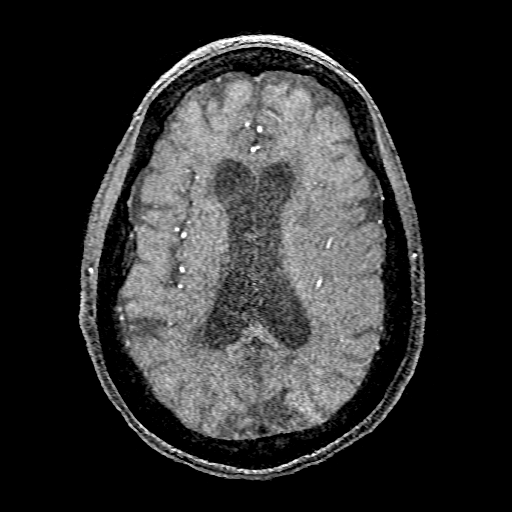
[im 156/184]
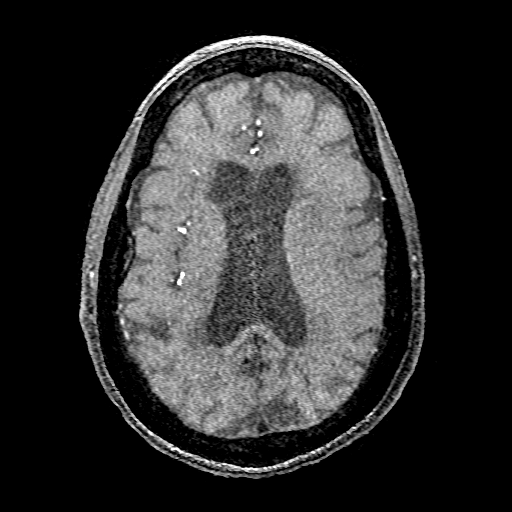
[im 176/184]
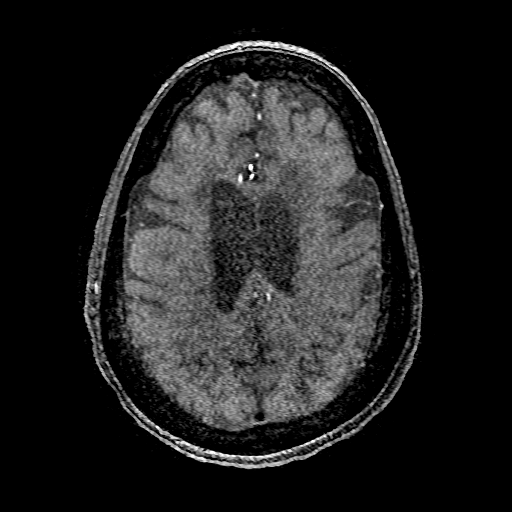

[20 of 48 positions shown; findings below may reference images not displayed]

FINDINGS: ANTERIOR CIRCULATION:

--Intracranial internal carotid arteries: Normal.

--Anterior cerebral arteries: Normal. Both A1 segments are present.
Patent anterior communicating artery.

--Middle cerebral arteries: Normal.

--Posterior communicating arteries: Present bilaterally.

POSTERIOR CIRCULATION:

--Basilar artery: Normal.

--Posterior cerebral arteries: Normal.

--Superior cerebellar arteries: Normal.

--Inferior cerebellar arteries: Normal anterior and posterior
inferior cerebellar arteries.
IMPRESSION: Normal intracranial MRA.

## 2020-01-23 ENCOUNTER — Encounter: Payer: Self-pay | Admitting: Family Medicine

## 2020-05-26 ENCOUNTER — Ambulatory Visit: Payer: Medicare Other | Admitting: Family Medicine

## 2021-05-25 DIAGNOSIS — E785 Hyperlipidemia, unspecified: Secondary | ICD-10-CM | POA: Diagnosis not present

## 2021-05-25 DIAGNOSIS — H109 Unspecified conjunctivitis: Secondary | ICD-10-CM | POA: Diagnosis not present

## 2021-05-25 DIAGNOSIS — Z130 Encounter for screening for diseases of the blood and blood-forming organs and certain disorders involving the immune mechanism: Secondary | ICD-10-CM | POA: Diagnosis not present

## 2021-05-25 DIAGNOSIS — Z0000 Encounter for general adult medical examination without abnormal findings: Secondary | ICD-10-CM | POA: Diagnosis not present

## 2021-05-25 DIAGNOSIS — G35 Multiple sclerosis: Secondary | ICD-10-CM | POA: Diagnosis not present

## 2021-05-25 DIAGNOSIS — Z13228 Encounter for screening for other metabolic disorders: Secondary | ICD-10-CM | POA: Diagnosis not present

## 2021-05-28 DIAGNOSIS — Z13228 Encounter for screening for other metabolic disorders: Secondary | ICD-10-CM | POA: Diagnosis not present

## 2021-05-28 DIAGNOSIS — Z130 Encounter for screening for diseases of the blood and blood-forming organs and certain disorders involving the immune mechanism: Secondary | ICD-10-CM | POA: Diagnosis not present

## 2021-05-28 DIAGNOSIS — E785 Hyperlipidemia, unspecified: Secondary | ICD-10-CM | POA: Diagnosis not present

## 2021-07-01 DIAGNOSIS — G35 Multiple sclerosis: Secondary | ICD-10-CM | POA: Diagnosis not present

## 2021-07-01 DIAGNOSIS — E785 Hyperlipidemia, unspecified: Secondary | ICD-10-CM | POA: Diagnosis not present

## 2021-07-27 DIAGNOSIS — G35 Multiple sclerosis: Secondary | ICD-10-CM | POA: Diagnosis not present

## 2021-07-27 DIAGNOSIS — E785 Hyperlipidemia, unspecified: Secondary | ICD-10-CM | POA: Diagnosis not present

## 2021-09-14 DIAGNOSIS — G35 Multiple sclerosis: Secondary | ICD-10-CM | POA: Diagnosis not present

## 2021-09-14 DIAGNOSIS — E785 Hyperlipidemia, unspecified: Secondary | ICD-10-CM | POA: Diagnosis not present

## 2021-10-11 DIAGNOSIS — G35 Multiple sclerosis: Secondary | ICD-10-CM | POA: Diagnosis not present

## 2021-10-11 DIAGNOSIS — E785 Hyperlipidemia, unspecified: Secondary | ICD-10-CM | POA: Diagnosis not present

## 2021-11-08 DIAGNOSIS — E785 Hyperlipidemia, unspecified: Secondary | ICD-10-CM | POA: Diagnosis not present

## 2021-11-08 DIAGNOSIS — G35 Multiple sclerosis: Secondary | ICD-10-CM | POA: Diagnosis not present

## 2021-11-24 DIAGNOSIS — G35 Multiple sclerosis: Secondary | ICD-10-CM | POA: Diagnosis not present

## 2021-11-27 DIAGNOSIS — E785 Hyperlipidemia, unspecified: Secondary | ICD-10-CM | POA: Diagnosis not present

## 2021-11-27 DIAGNOSIS — G35 Multiple sclerosis: Secondary | ICD-10-CM | POA: Diagnosis not present

## 2022-09-22 DIAGNOSIS — G35 Multiple sclerosis: Secondary | ICD-10-CM | POA: Diagnosis not present

## 2022-09-22 DIAGNOSIS — Z23 Encounter for immunization: Secondary | ICD-10-CM | POA: Diagnosis not present

## 2022-11-08 ENCOUNTER — Encounter (HOSPITAL_BASED_OUTPATIENT_CLINIC_OR_DEPARTMENT_OTHER): Payer: Self-pay

## 2022-11-08 ENCOUNTER — Emergency Department (HOSPITAL_BASED_OUTPATIENT_CLINIC_OR_DEPARTMENT_OTHER): Payer: Medicare Other

## 2022-11-08 ENCOUNTER — Other Ambulatory Visit: Payer: Self-pay

## 2022-11-08 DIAGNOSIS — S63284A Dislocation of proximal interphalangeal joint of right ring finger, initial encounter: Secondary | ICD-10-CM | POA: Diagnosis not present

## 2022-11-08 DIAGNOSIS — S63286A Dislocation of proximal interphalangeal joint of right little finger, initial encounter: Secondary | ICD-10-CM | POA: Diagnosis not present

## 2022-11-08 DIAGNOSIS — W19XXXA Unspecified fall, initial encounter: Secondary | ICD-10-CM | POA: Diagnosis not present

## 2022-11-08 DIAGNOSIS — I10 Essential (primary) hypertension: Secondary | ICD-10-CM | POA: Diagnosis not present

## 2022-11-08 DIAGNOSIS — Y92009 Unspecified place in unspecified non-institutional (private) residence as the place of occurrence of the external cause: Secondary | ICD-10-CM | POA: Diagnosis not present

## 2022-11-08 DIAGNOSIS — S60946A Unspecified superficial injury of right little finger, initial encounter: Secondary | ICD-10-CM | POA: Diagnosis present

## 2022-11-08 DIAGNOSIS — Z4789 Encounter for other orthopedic aftercare: Secondary | ICD-10-CM | POA: Diagnosis not present

## 2022-11-08 NOTE — ED Triage Notes (Signed)
Pt fell while getting up and landed on her right fifth finger. Pt's finger is obviously dislocated.

## 2022-11-09 ENCOUNTER — Emergency Department (HOSPITAL_BASED_OUTPATIENT_CLINIC_OR_DEPARTMENT_OTHER): Payer: Medicare Other

## 2022-11-09 ENCOUNTER — Emergency Department (HOSPITAL_BASED_OUTPATIENT_CLINIC_OR_DEPARTMENT_OTHER)
Admission: EM | Admit: 2022-11-09 | Discharge: 2022-11-09 | Disposition: A | Discharge status: Home / Self Care | Payer: Medicare Other | Attending: Emergency Medicine | Admitting: Emergency Medicine

## 2022-11-09 DIAGNOSIS — I10 Essential (primary) hypertension: Secondary | ICD-10-CM

## 2022-11-09 DIAGNOSIS — Z4789 Encounter for other orthopedic aftercare: Secondary | ICD-10-CM | POA: Diagnosis not present

## 2022-11-09 DIAGNOSIS — S63284A Dislocation of proximal interphalangeal joint of right ring finger, initial encounter: Secondary | ICD-10-CM

## 2022-11-09 DIAGNOSIS — S63286A Dislocation of proximal interphalangeal joint of right little finger, initial encounter: Secondary | ICD-10-CM | POA: Diagnosis not present

## 2022-11-09 NOTE — Discharge Instructions (Signed)
Wear the splint until you see the hand specialist.  Apply ice for 30 minutes at a time, 4 times a day.  Keep your hand elevated is much as possible.  You may take ibuprofen or naproxen as needed for pain.  If you need additional pain relief, you may add acetaminophen.

## 2022-11-09 NOTE — ED Notes (Signed)
Finger Splint applied to R 5th finger

## 2022-11-09 NOTE — ED Provider Notes (Signed)
[facility] Provider Note   CSN: [id number] Arrival date & time: 11/08/22  2256     History  Chief Complaint  Patient presents with   Finger Injury    Allison Bailey is a 77 y.o. female.  The history is provided by the patient.  She has history of tension, hyperlipidemia, multiple sclerosis, stroke and comes in after falling at home and injuring her right fifth finger.  She denies other injury.   Home Medications Prior to Admission medications   Medication Sig Start Date End Date Taking? Authorizing Provider  acetaminophen (TYLENOL) 500 MG tablet Take 1,000 mg by mouth every 6 (six) hours as needed for mild pain.    [provider]  amLODipine (NORVASC) 2.5 MG tablet Take 1 tablet (2.5 mg total) by mouth daily. 06/11/19   Lendon Colonel, NP  atorvastatin (LIPITOR) 10 MG tablet Take 1 tablet (10 mg total) by mouth daily at 6 PM. 05/24/19   Wendling, Crosby Oyster, DO  hydrochlorothiazide (MICROZIDE) 12.5 MG capsule Take 1 capsule (12.5 mg total) by mouth daily. 06/11/19 09/09/19  Lendon Colonel, NP      Allergies    Patient has no known allergies.    Review of Systems   Review of Systems  All other systems reviewed and are negative.   Physical Exam Updated Vital Signs BP (!) 178/101   Pulse 75   Temp 98.6 F (37 C) (Oral)   Resp 17   Ht 5\' 4"  (1.626 m)   Wt 72.6 kg   SpO2 100%   BMI 27.46 kg/m  Physical Exam Vitals and nursing note reviewed.   77 year old female, resting comfortably and in no acute distress. Vital signs are significant for elevated blood pressure. Oxygen saturation is 100%, which is normal. Head is normocephalic and atraumatic. PERRLA, EOMI. Oropharynx is clear. Neck is nontender and supple. Back is nontender. Lungs are clear without rales, wheezes, or rhonchi. Chest is nontender. Heart has regular rate and rhythm without murmur. Abdomen is soft, flat, nontender. Extremities:  Deformity noted right fifth finger consistent with dislocation at the PIP joint.  Distal neurovascular exam is intact with normal sensation and prompt capillary refill.  No other extremity injury seen. Skin is warm and dry without rash. Neurologic: Mental status is normal, cranial nerves are intact, moves all extremities equally.  ED Results / Procedures / Treatments    Radiology DG Finger Little Right  Result Date: 11/09/2022 CLINICAL DATA:  Right fifth digit injury following fall. EXAM: RIGHT LITTLE FINGER 2+V COMPARISON:  None Available. FINDINGS: No acute fracture is seen. There is medial dislocation of the middle phalanx at the proximal interphalangeal joint. The soft tissues are within normal limits. IMPRESSION: Medial dislocation of the middle phalanx of the fifth digit at the proximal interphalangeal joint. Electronically Signed   By: Brett Fairy M.D.   On: 11/09/2022 00:10    Procedures Reduction of dislocation  Date/Time: 11/09/2022 1:06 AM  Performed by: Delora Fuel, MD Authorized by: Delora Fuel, MD  Consent: Verbal consent obtained. Written consent not obtained. Risks and benefits: risks, benefits and alternatives were discussed Consent given by: patient Patient understanding: patient states understanding of the procedure being performed Patient consent: the patient's understanding of the procedure matches consent given Procedure consent: procedure consent matches procedure scheduled Relevant documents: relevant documents present and verified Test results: test results available and properly labeled Site marked: the operative site was marked Imaging studies: imaging  studies available Required items: required blood products, implants, devices, and special equipment available Patient identity confirmed: verbally with patient and hospital-assigned identification number Time out: Immediately prior to procedure a "time out" was called to verify the correct patient, procedure,  equipment, support staff and site/side marked as required. Local anesthesia used: no  Anesthesia: Local anesthesia used: no  Sedation: Patient sedated: no  Patient tolerance: patient tolerated the procedure well with no immediate complications Comments: Successful reduction by manipulation.  Postreduction x-ray has been ordered.   M[facility] Passey Injury Treatment  Date/Time: 11/09/2022 1:07 AM  Performed by: Delora Fuel, MD Authorized by: Delora Fuel, MD   Consent:    Consent obtained:  Verbal   Consent given by:  Patient   Risks discussed:  Restricted joint movement and stiffness   Alternatives discussed:  No treatmentInjury location: finger Location details: right little finger Pre-procedure neurovascular assessment: neurovascularly intact Pre-procedure distal perfusion: normal Pre-procedure neurological function: normal Pre-procedure range of motion: reduced  Anesthesia: Local anesthesia used: no  Patient sedated: NoImmobilization: splint Splint type: static finger Splint Applied by: ED Nurse Supplies used: Static finger splint. Post-procedure distal perfusion: normal Post-procedure neurological function: normal Post-procedure range of motion: unchanged       Medications Ordered in ED Medications - No data to display  ED Course/ Medical Decision Making/ A&P                             Medical Decision Making Amount and/or Complexity of Data Reviewed Radiology: ordered.   Injury to right fifth finger which appears to be dislocation at the PIP joint.  X-rays are obtained confirming dislocation of the right fifth PIP joint.  I have independently viewed the images, and agree with radiologist's interpretation.  I have reduced the dislocation with simple manipulation and I have ordered a finger splint be applied and postreduction x-ray be obtained.  Postreduction x-ray shows adequate reduction but questionable nondisplaced fracture of the base of the middle phalanx.  I  have independently viewed the images, and agree with the radiologist's interpretation.  Patient is tender at that area, but it is also an area that would expect to be tender because of recent dislocation.  I have discussed case with Dr. Grandville Silos of hand surgery service who will see the patient in follow-up.  She is instructed on applying ice and keeping the finger elevated, told to use over-the-counter NSAIDs and acetaminophen as needed for pain.  Final Clinical Impression(s) / ED Diagnoses Final diagnoses:  Closed traumatic dislocation of proximal interphalangeal (PIP) joint of right ring finger  Elevated blood pressure reading with diagnosis of hypertension    Rx / DC Orders ED Discharge Orders     None         Delora Fuel, MD [phone]

## 2023-10-02 DIAGNOSIS — R030 Elevated blood-pressure reading, without diagnosis of hypertension: Secondary | ICD-10-CM | POA: Diagnosis not present

## 2023-10-02 DIAGNOSIS — Z131 Encounter for screening for diabetes mellitus: Secondary | ICD-10-CM | POA: Diagnosis not present

## 2023-10-02 DIAGNOSIS — B351 Tinea unguium: Secondary | ICD-10-CM | POA: Diagnosis not present

## 2023-10-02 DIAGNOSIS — G35 Multiple sclerosis: Secondary | ICD-10-CM | POA: Diagnosis not present

## 2023-10-02 DIAGNOSIS — Z0001 Encounter for general adult medical examination with abnormal findings: Secondary | ICD-10-CM | POA: Diagnosis not present

## 2023-10-02 DIAGNOSIS — Z23 Encounter for immunization: Secondary | ICD-10-CM | POA: Diagnosis not present

## 2023-10-23 DIAGNOSIS — M6281 Muscle weakness (generalized): Secondary | ICD-10-CM | POA: Diagnosis not present

## 2023-10-23 DIAGNOSIS — I70203 Unspecified atherosclerosis of native arteries of extremities, bilateral legs: Secondary | ICD-10-CM | POA: Diagnosis not present

## 2023-10-23 DIAGNOSIS — B351 Tinea unguium: Secondary | ICD-10-CM | POA: Diagnosis not present

## 2023-11-04 DIAGNOSIS — B351 Tinea unguium: Secondary | ICD-10-CM | POA: Diagnosis not present

## 2023-12-18 DIAGNOSIS — B351 Tinea unguium: Secondary | ICD-10-CM | POA: Diagnosis not present

## 2023-12-18 DIAGNOSIS — M6281 Muscle weakness (generalized): Secondary | ICD-10-CM | POA: Diagnosis not present

## 2024-06-17 ENCOUNTER — Telehealth: Payer: Self-pay

## 2024-06-17 NOTE — Telephone Encounter (Signed)
 Left message for patient to call and make an appointment for overdue FU if they are still a patient here.

## 2024-07-02 ENCOUNTER — Telehealth: Payer: Self-pay

## 2024-07-02 NOTE — Telephone Encounter (Signed)
 Patient is overdue for an appt. I calle dthe patient to make appt. Husband said she wasn't in. I asked him to have her call [id number] when she returned.
